# Patient Record
Sex: Male | Born: 1975 | Race: White | Hispanic: No | Marital: Married | Smoking: Former smoker
Health system: Southern US, Community
[De-identification: ages and names within clinical notes are randomized; demographics above are authoritative.]

## PROBLEM LIST (undated history)

## (undated) ENCOUNTER — Emergency Department: Admission: EM | Payer: Self-pay

## (undated) DIAGNOSIS — F32A Depression, unspecified: Secondary | ICD-10-CM

## (undated) DIAGNOSIS — G9340 Encephalopathy, unspecified: Secondary | ICD-10-CM

## (undated) DIAGNOSIS — F329 Major depressive disorder, single episode, unspecified: Secondary | ICD-10-CM

## (undated) DIAGNOSIS — I82409 Acute embolism and thrombosis of unspecified deep veins of unspecified lower extremity: Secondary | ICD-10-CM

## (undated) DIAGNOSIS — J9621 Acute and chronic respiratory failure with hypoxia: Secondary | ICD-10-CM

## (undated) DIAGNOSIS — S271XXA Traumatic hemothorax, initial encounter: Secondary | ICD-10-CM

## (undated) DIAGNOSIS — Z889 Allergy status to unspecified drugs, medicaments and biological substances status: Secondary | ICD-10-CM

## (undated) DIAGNOSIS — J690 Pneumonitis due to inhalation of food and vomit: Secondary | ICD-10-CM

## (undated) HISTORY — DX: Acute and chronic respiratory failure with hypoxia: J96.21

## (undated) HISTORY — DX: Pneumonitis due to inhalation of food and vomit: J69.0

## (undated) HISTORY — PX: KNEE ARTHROSCOPY: SHX127

## (undated) HISTORY — DX: Encephalopathy, unspecified: G93.40

## (undated) HISTORY — DX: Traumatic hemothorax, initial encounter: S27.1XXA

## (undated) HISTORY — PX: FRACTURE SURGERY: SHX138

---

## 2005-01-27 ENCOUNTER — Emergency Department: Payer: Self-pay | Admitting: Emergency Medicine

## 2005-03-02 ENCOUNTER — Emergency Department: Payer: Self-pay | Admitting: Internal Medicine

## 2005-07-27 ENCOUNTER — Emergency Department: Payer: Self-pay | Admitting: Emergency Medicine

## 2006-03-03 ENCOUNTER — Emergency Department: Payer: Self-pay | Admitting: Emergency Medicine

## 2006-11-27 ENCOUNTER — Emergency Department: Payer: Self-pay | Admitting: Emergency Medicine

## 2007-05-29 ENCOUNTER — Emergency Department: Payer: Self-pay | Admitting: Emergency Medicine

## 2008-03-25 ENCOUNTER — Emergency Department: Payer: Self-pay | Admitting: Emergency Medicine

## 2008-04-01 ENCOUNTER — Emergency Department: Payer: Self-pay | Admitting: Emergency Medicine

## 2008-11-27 ENCOUNTER — Emergency Department: Payer: Self-pay | Admitting: Emergency Medicine

## 2008-12-05 ENCOUNTER — Observation Stay: Payer: Self-pay | Admitting: *Deleted

## 2009-02-25 ENCOUNTER — Emergency Department: Payer: Self-pay | Admitting: Emergency Medicine

## 2009-03-12 ENCOUNTER — Emergency Department: Payer: Self-pay | Admitting: Emergency Medicine

## 2009-12-13 ENCOUNTER — Emergency Department: Payer: Self-pay | Admitting: Emergency Medicine

## 2009-12-22 ENCOUNTER — Emergency Department: Payer: Self-pay | Admitting: Emergency Medicine

## 2010-01-31 ENCOUNTER — Emergency Department: Payer: Self-pay | Admitting: Emergency Medicine

## 2011-03-28 ENCOUNTER — Emergency Department: Payer: Self-pay | Admitting: Unknown Physician Specialty

## 2011-11-03 ENCOUNTER — Emergency Department: Payer: Self-pay | Admitting: Emergency Medicine

## 2011-11-12 ENCOUNTER — Emergency Department: Payer: Self-pay | Admitting: Internal Medicine

## 2013-01-07 ENCOUNTER — Emergency Department: Payer: Self-pay | Admitting: Emergency Medicine

## 2013-08-08 ENCOUNTER — Other Ambulatory Visit: Payer: Self-pay | Admitting: Sports Medicine

## 2013-08-08 DIAGNOSIS — M79671 Pain in right foot: Secondary | ICD-10-CM

## 2013-08-12 ENCOUNTER — Emergency Department: Payer: Self-pay | Admitting: Emergency Medicine

## 2013-08-19 ENCOUNTER — Ambulatory Visit
Admission: RE | Admit: 2013-08-19 | Discharge: 2013-08-19 | Disposition: A | Discharge status: Home / Self Care | Payer: BC Managed Care – PPO | Source: Ambulatory Visit | Attending: Sports Medicine | Admitting: Sports Medicine

## 2013-08-19 DIAGNOSIS — M79671 Pain in right foot: Secondary | ICD-10-CM

## 2013-08-19 MED ORDER — GADOBENATE DIMEGLUMINE 529 MG/ML IV SOLN
20.0000 mL | Freq: Once | INTRAVENOUS | Status: AC | PRN
  Administered 2013-08-19: 20 mL via INTRAVENOUS

## 2013-09-16 ENCOUNTER — Other Ambulatory Visit (HOSPITAL_COMMUNITY): Payer: Self-pay | Admitting: Orthopedic Surgery

## 2013-09-18 ENCOUNTER — Encounter (HOSPITAL_COMMUNITY): Payer: Self-pay | Admitting: Pharmacy Technician

## 2013-09-23 ENCOUNTER — Ambulatory Visit (HOSPITAL_COMMUNITY)
Admission: RE | Admit: 2013-09-23 | Discharge: 2013-09-23 | Disposition: A | Discharge status: Home / Self Care | Payer: BC Managed Care – PPO | Source: Ambulatory Visit | Attending: Orthopedic Surgery | Admitting: Orthopedic Surgery

## 2013-09-23 ENCOUNTER — Encounter (HOSPITAL_COMMUNITY): Payer: Self-pay

## 2013-09-23 ENCOUNTER — Encounter (HOSPITAL_COMMUNITY)
Admission: RE | Admit: 2013-09-23 | Discharge: 2013-09-23 | Disposition: A | Discharge status: Home / Self Care | Payer: BC Managed Care – PPO | Source: Ambulatory Visit | Attending: Orthopedic Surgery | Admitting: Orthopedic Surgery

## 2013-09-23 DIAGNOSIS — Z01812 Encounter for preprocedural laboratory examination: Secondary | ICD-10-CM | POA: Insufficient documentation

## 2013-09-23 DIAGNOSIS — Z01810 Encounter for preprocedural cardiovascular examination: Secondary | ICD-10-CM | POA: Insufficient documentation

## 2013-09-23 DIAGNOSIS — Z01818 Encounter for other preprocedural examination: Secondary | ICD-10-CM

## 2013-09-23 HISTORY — DX: Allergy status to unspecified drugs, medicaments and biological substances: Z88.9

## 2013-09-23 HISTORY — DX: Acute embolism and thrombosis of unspecified deep veins of unspecified lower extremity: I82.409

## 2013-09-23 LAB — CBC
HCT: 42.2 % (ref 39.0–52.0)
Hemoglobin: 14.7 g/dL (ref 13.0–17.0)
MCH: 32.5 pg (ref 26.0–34.0)
MCHC: 34.8 g/dL (ref 30.0–36.0)
MCV: 93.2 fL (ref 78.0–100.0)
Platelets: 214 10*3/uL (ref 150–400)
RBC: 4.53 MIL/uL (ref 4.22–5.81)
RDW: 12.8 % (ref 11.5–15.5)
WBC: 7.8 10*3/uL (ref 4.0–10.5)

## 2013-09-23 LAB — COMPREHENSIVE METABOLIC PANEL
ALBUMIN: 4.1 g/dL (ref 3.5–5.2)
ALT: 39 U/L (ref 0–53)
AST: 23 U/L (ref 0–37)
Alkaline Phosphatase: 67 U/L (ref 39–117)
BUN: 14 mg/dL (ref 6–23)
CALCIUM: 9.1 mg/dL (ref 8.4–10.5)
CHLORIDE: 106 meq/L (ref 96–112)
CO2: 28 meq/L (ref 19–32)
CREATININE: 1.04 mg/dL (ref 0.50–1.35)
GFR calc Af Amer: >90 mL/min (ref >90)
GFR calc non Af Amer: 90 mL/min — ABNORMAL LOW (ref >90)
Glucose, Bld: 120 mg/dL — ABNORMAL HIGH (ref 70–99)
Potassium: 4.2 mEq/L (ref 3.7–5.3)
Sodium: 146 mEq/L (ref 137–147)
Total Bilirubin: 0.3 mg/dL (ref 0.3–1.2)
Total Protein: 7.2 g/dL (ref 6.0–8.3)

## 2013-09-23 LAB — PROTIME-INR
INR: 0.95 (ref 0.00–1.49)
Prothrombin Time: 12.5 seconds (ref 11.6–15.2)

## 2013-09-23 MED ORDER — DEXTROSE 5 % IV SOLN
3.0000 g | INTRAVENOUS | Status: AC
  Administered 2013-09-24: 3 g via INTRAVENOUS
  Filled 2013-09-23: qty 3000

## 2013-09-23 NOTE — Pre-Procedure Instructions (Signed)
Edward Soto  09/23/2013   Your procedure is scheduled on Wednesday, January 28.  Report to [facility]/ Entrance "A" at 9:35AM.  Call this number if you have problems the morning of surgery: [phone]   Remember:   Do not eat food or drink liquids after midnight.   Take these medicines the morning of surgery with A SIP OF WATER: none   Do not wear jewelry, make-up or nail polish.  Do not wear lotions, powders, or perfumes. You may wear deodorant.  Do not shave 48 hours prior to surgery. Men may shave face and neck.  Do not bring valuables to the hospital.  [facility] is not responsible for any belongings or valuables.               Contacts, dentures or bridgework may not be worn into surgery.  Leave suitcase in the car. After surgery it may be brought to your room.  For patients admitted to the hospital, discharge time is determined by your  treatment team.               Patients discharged the day of surgery will not be allowed to drive home.  Name and phone number of your driver: -   Special Instructions: Shower with CHG wash (Bactoshield) tonight and again in the am prior to arriving to hospital.   Please read over the following fact sheets that you were given: Pain Booklet, Coughing and Deep Breathing and Surgical Site Infection Prevention

## 2013-09-24 ENCOUNTER — Encounter (HOSPITAL_COMMUNITY): Payer: BC Managed Care – PPO | Admitting: Certified Registered"

## 2013-09-24 ENCOUNTER — Encounter (HOSPITAL_COMMUNITY)
Admission: RE | Disposition: A | Discharge status: Home / Self Care | Payer: Self-pay | Source: Ambulatory Visit | Attending: Orthopedic Surgery

## 2013-09-24 ENCOUNTER — Ambulatory Visit (HOSPITAL_COMMUNITY): Payer: BC Managed Care – PPO | Admitting: Certified Registered"

## 2013-09-24 ENCOUNTER — Ambulatory Visit (HOSPITAL_COMMUNITY)
Admission: RE | Admit: 2013-09-24 | Discharge: 2013-09-24 | Disposition: A | Discharge status: Home / Self Care | Payer: BC Managed Care – PPO | Source: Ambulatory Visit | Attending: Orthopedic Surgery | Admitting: Orthopedic Surgery

## 2013-09-24 DIAGNOSIS — Z86718 Personal history of other venous thrombosis and embolism: Secondary | ICD-10-CM | POA: Insufficient documentation

## 2013-09-24 DIAGNOSIS — F17200 Nicotine dependence, unspecified, uncomplicated: Secondary | ICD-10-CM | POA: Insufficient documentation

## 2013-09-24 DIAGNOSIS — D1801 Hemangioma of skin and subcutaneous tissue: Secondary | ICD-10-CM | POA: Insufficient documentation

## 2013-09-24 DIAGNOSIS — E669 Obesity, unspecified: Secondary | ICD-10-CM | POA: Insufficient documentation

## 2013-09-24 HISTORY — PX: MASS EXCISION: SHX2000

## 2013-09-24 SURGERY — EXCISION MASS
Anesthesia: General | Site: Foot | Laterality: Right

## 2013-09-24 MED ORDER — MIDAZOLAM HCL 5 MG/5ML IJ SOLN
INTRAMUSCULAR | Status: DC | PRN
  Administered 2013-09-24: 2 mg via INTRAVENOUS

## 2013-09-24 MED ORDER — 0.9 % SODIUM CHLORIDE (POUR BTL) OPTIME
TOPICAL | Status: DC | PRN
  Administered 2013-09-24: 1000 mL

## 2013-09-24 MED ORDER — LACTATED RINGERS IV SOLN
INTRAVENOUS | Status: DC | PRN
  Administered 2013-09-24: 12:00:00 via INTRAVENOUS

## 2013-09-24 MED ORDER — HYDROMORPHONE HCL PF 1 MG/ML IJ SOLN
INTRAMUSCULAR | Status: AC
  Filled 2013-09-24: qty 1

## 2013-09-24 MED ORDER — ONDANSETRON HCL 4 MG/2ML IJ SOLN
INTRAMUSCULAR | Status: AC
  Filled 2013-09-24: qty 2

## 2013-09-24 MED ORDER — ONDANSETRON HCL 4 MG/2ML IJ SOLN
INTRAMUSCULAR | Status: DC | PRN
  Administered 2013-09-24: 4 mg via INTRAVENOUS

## 2013-09-24 MED ORDER — ALBUTEROL SULFATE HFA 108 (90 BASE) MCG/ACT IN AERS
INHALATION_SPRAY | RESPIRATORY_TRACT | Status: AC
  Filled 2013-09-24: qty 6.7

## 2013-09-24 MED ORDER — LACTATED RINGERS IV SOLN: INTRAVENOUS | Status: DC

## 2013-09-24 MED ORDER — OXYCODONE HCL 5 MG/5ML PO SOLN: 5.0000 mg | Freq: Once | ORAL | Status: AC | PRN

## 2013-09-24 MED ORDER — LIDOCAINE HCL (CARDIAC) 20 MG/ML IV SOLN: INTRAVENOUS | Status: DC | PRN

## 2013-09-24 MED ORDER — OXYCODONE-ACETAMINOPHEN 5-325 MG PO TABS: 1.0000 | ORAL_TABLET | ORAL | Status: DC | PRN

## 2013-09-24 MED ORDER — SUCCINYLCHOLINE CHLORIDE 20 MG/ML IJ SOLN
INTRAMUSCULAR | Status: DC | PRN
  Administered 2013-09-24: 140 mg via INTRAVENOUS

## 2013-09-24 MED ORDER — FENTANYL CITRATE 0.05 MG/ML IJ SOLN
INTRAMUSCULAR | Status: DC | PRN
  Administered 2013-09-24 (×2): 50 ug via INTRAVENOUS
  Administered 2013-09-24: 150 ug via INTRAVENOUS

## 2013-09-24 MED ORDER — PROPOFOL 10 MG/ML IV BOLUS
INTRAVENOUS | Status: DC | PRN
  Administered 2013-09-24: 200 mg via INTRAVENOUS

## 2013-09-24 MED ORDER — OXYCODONE HCL 5 MG PO TABS: 5.0000 mg | ORAL_TABLET | Freq: Once | ORAL | Status: AC | PRN

## 2013-09-24 MED ORDER — HYDROMORPHONE HCL PF 1 MG/ML IJ SOLN
0.2500 mg | INTRAMUSCULAR | Status: DC | PRN
  Administered 2013-09-24: 0.5 mg via INTRAVENOUS

## 2013-09-24 MED ORDER — ONDANSETRON HCL 4 MG/2ML IJ SOLN: 4.0000 mg | Freq: Once | INTRAMUSCULAR | Status: AC | PRN

## 2013-09-24 MED ORDER — ALBUTEROL SULFATE HFA 108 (90 BASE) MCG/ACT IN AERS
INHALATION_SPRAY | RESPIRATORY_TRACT | Status: DC | PRN
  Administered 2013-09-24: 2 via RESPIRATORY_TRACT

## 2013-09-24 MED ORDER — LIDOCAINE HCL (CARDIAC) 20 MG/ML IV SOLN
INTRAVENOUS | Status: DC | PRN
Start: 2013-09-24 — End: 2013-09-24
  Administered 2013-09-24: 60 mg via INTRAVENOUS

## 2013-09-24 SURGICAL SUPPLY — 40 items
BANDAGE GAUZE 4  KLING STR (GAUZE/BANDAGES/DRESSINGS) IMPLANT
BNDG COHESIVE 6X5 TAN STRL LF (GAUZE/BANDAGES/DRESSINGS) ×3 IMPLANT
BNDG ESMARK 4X9 LF (GAUZE/BANDAGES/DRESSINGS) ×3 IMPLANT
BNDG GAUZE ELAST 4 BULKY (GAUZE/BANDAGES/DRESSINGS) ×3 IMPLANT
BNDG GAUZE STRTCH 6 (GAUZE/BANDAGES/DRESSINGS) IMPLANT
CLOTH BEACON ORANGE TIMEOUT ST (SAFETY) IMPLANT
CORDS BIPOLAR (ELECTRODE) IMPLANT
COVER SURGICAL LIGHT HANDLE (MISCELLANEOUS) ×3 IMPLANT
DRAPE U-SHAPE 47X51 STRL (DRAPES) ×3 IMPLANT
DRSG ADAPTIC 3X8 NADH LF (GAUZE/BANDAGES/DRESSINGS) ×3 IMPLANT
DRSG PAD ABDOMINAL 8X10 ST (GAUZE/BANDAGES/DRESSINGS) ×3 IMPLANT
DURAPREP 26ML APPLICATOR (WOUND CARE) ×3 IMPLANT
ELECT REM PT RETURN 9FT ADLT (ELECTROSURGICAL) ×3
ELECTRODE REM PT RTRN 9FT ADLT (ELECTROSURGICAL) ×1 IMPLANT
GLOVE BIOGEL PI IND STRL 9 (GLOVE) ×1 IMPLANT
GLOVE BIOGEL PI INDICATOR 9 (GLOVE) ×2
GLOVE SURG ORTHO 9.0 STRL STRW (GLOVE) ×3 IMPLANT
GOWN PREVENTION PLUS XLARGE (GOWN DISPOSABLE) ×9 IMPLANT
GOWN SRG XL XLNG 56XLVL 4 (GOWN DISPOSABLE) IMPLANT
GOWN STRL NON-REIN XL XLG LVL4 (GOWN DISPOSABLE)
KIT BASIN OR (CUSTOM PROCEDURE TRAY) ×3 IMPLANT
KIT ROOM TURNOVER OR (KITS) ×3 IMPLANT
MANIFOLD NEPTUNE II (INSTRUMENTS) IMPLANT
NEEDLE HYPO 25GX1X1/2 BEV (NEEDLE) IMPLANT
NS IRRIG 1000ML POUR BTL (IV SOLUTION) ×3 IMPLANT
PACK ORTHO EXTREMITY (CUSTOM PROCEDURE TRAY) ×3 IMPLANT
PAD ARMBOARD 7.5X6 YLW CONV (MISCELLANEOUS) ×6 IMPLANT
PAD CAST 4YDX4 CTTN HI CHSV (CAST SUPPLIES) IMPLANT
PADDING CAST COTTON 4X4 STRL (CAST SUPPLIES)
SPECIMEN JAR SMALL (MISCELLANEOUS) IMPLANT
SPONGE GAUZE 4X4 12PLY (GAUZE/BANDAGES/DRESSINGS) ×3 IMPLANT
SUCTION FRAZIER TIP 10 FR DISP (SUCTIONS) IMPLANT
SUT ETHILON 2 0 PSLX (SUTURE) ×3 IMPLANT
SUT VIC AB 2-0 FS1 27 (SUTURE) IMPLANT
SYR CONTROL 10ML LL (SYRINGE) IMPLANT
TOWEL OR 17X24 6PK STRL BLUE (TOWEL DISPOSABLE) ×3 IMPLANT
TOWEL OR 17X26 10 PK STRL BLUE (TOWEL DISPOSABLE) ×3 IMPLANT
TUBE CONNECTING 12'X1/4 (SUCTIONS)
TUBE CONNECTING 12X1/4 (SUCTIONS) IMPLANT
WATER STERILE IRR 1000ML POUR (IV SOLUTION) IMPLANT

## 2013-09-24 NOTE — Anesthesia Postprocedure Evaluation (Signed)
  Anesthesia Post-op Note  Patient: Edward Soto  Procedure(s) Performed: Procedure(s) with comments: EXCISION MASS (Right) - Right Foot Excision Dorsal and Plantar Hemangioma  Patient Location: PACU  Anesthesia Type:General  Level of Consciousness: awake, alert  and oriented  Airway and Oxygen Therapy: Patient Spontanous Breathing  Post-op Pain: mild  Post-op Assessment: Post-op Vital signs reviewed, Patient's Cardiovascular Status Stable, Respiratory Function Stable, Patent Airway, No signs of Nausea or vomiting and Pain level controlled  Post-op Vital Signs: stable  Complications: No apparent anesthesia complications

## 2013-09-24 NOTE — Op Note (Signed)
OPERATIVE REPORT  DATE OF SURGERY: 09/24/2013  PATIENT:  Edward Soto,  38 y.o. male  PRE-OPERATIVE DIAGNOSIS:  Hemangioma Right Foot  POST-OPERATIVE DIAGNOSIS:  hemangioma right foot  PROCEDURE:  Procedure(s): EXCISION MASS right foot  SURGEON:  Surgeon(s): Newt Minion, MD  ANESTHESIA:   general  EBL:  Minimal ML  SPECIMEN:  Source of Specimen:  Hemangioma plantar aspect right foot  TOURNIQUET:  * No tourniquets in log *  PROCEDURE DETAILS: Patient is a 38 year old gentleman who complains of a painful mass on the plantar aspect right foot. Patient has failed conservative care. MRI scan shows a large hemangioma on the plantar aspect right foot as well as a small hemangioma mass in the dorsum of the right foot. Due to pain with activities daily living and pain with shoe wear unable to work due to pain patient was to proceed with excision of the painful mass. Risks and benefits were discussed including infection neurovascular injury nonhealing of the wound potential for malignancy. Patient states he understands and wished to proceed at this time. Description of procedure patient was brought to the operating room and underwent a general anesthetic. After adequate levels of anesthesia were obtained patient's right lower extremity was prepped using DuraPrep and draped into a sterile field. A dorsal incision was made over these mobile mass over the dorsum of the third metatarsal. This appeared to have more consistency of adipose fat as well as hemangioma it tissue. This was resected the branches of the superficial peroneal nerve were protected the wound was irrigated with normal saline the incision was closed in 2-0 nylon. Attention was then focused to the plantar aspect of foot. Patient had 2 lobulated large masses the incision was made midline longitudinally between these masses. The masses were resected in one block of tissue down to the plantar fascia. The Esmarch was released and hemostasis  was obtained the incision was healed with normal saline. The tissue mass was sent to pathology for confirmation of hemangioma. The incision was closed using 2-0 nylon. The soft tissue mass in the plantar aspect of the foot measured approximately 5 x 8 cm in diameter. The wound was closed after hemostasis was obtained with 2-0 nylon. The wounds were covered Adaptic orthopedic sponges ABDs dressing Kerlix and Coban. Patient was extubated taken to the PACU in stable condition.  PLAN OF CARE: Discharge to home after PACU  PATIENT DISPOSITION:  PACU - hemodynamically stable.   Newt Minion, MD 09/24/2013 12:38 PM

## 2013-09-24 NOTE — Progress Notes (Signed)
Anesthesiology Postoperative Note:  Mr. Edward Soto is a 38 year old male with obesity and a history of smoking. He underwent resection of a right foot dorsal and plantar hemangioma by Dr. Sharol Given  today. The surgery was uneventful and he had mild wheezing and frequent coughing during emergence  from anesthesia. While in the Recovery Room, his oxygen saturations very from 85-98% on room air. He denied any shortness of breath and his breath sounds were clear with no wheezing. Following incentive spirometry and movement to the subacute side of the recovery room, his oxygen saturations remained at 90-92% on room air. It was determined that he was stable for discharge home.   Roberts Gaudy, MD

## 2013-09-24 NOTE — Progress Notes (Signed)
Orthopedic Tech Progress Note Patient Details:  Edward Soto [birth year] [id number]  Ortho Devices Type of Ortho Device: Crutches;Postop shoe/boot Ortho Device/Splint Interventions: Application   Cammer, Theodoro Parma 09/24/2013, 3:18 PM

## 2013-09-24 NOTE — Anesthesia Procedure Notes (Signed)
Procedure Name: Intubation Date/Time: 09/24/2013 12:06 PM Performed by: Maeola Harman Pre-anesthesia Checklist: Patient identified, Emergency Drugs available, Suction available, Patient being monitored and Timeout performed Patient Re-evaluated:Patient Re-evaluated prior to inductionOxygen Delivery Method: Circle system utilized Preoxygenation: Pre-oxygenation with 100% oxygen Intubation Type: IV induction and Rapid sequence Ventilation: Two handed mask ventilation required and Oral airway inserted - appropriate to patient size Laryngoscope Size: Mac and 3 Grade View: Grade I Tube type: Oral Tube size: 7.5 mm Number of attempts: 1 Airway Equipment and Method: Stylet Placement Confirmation: ETT inserted through vocal cords under direct vision,  positive ETCO2 and breath sounds checked- equal and bilateral Secured at: 22 cm Tube secured with: Tape Dental Injury: Teeth and Oropharynx as per pre-operative assessment  Comments: Easy induction and LMA # 5 placed with air leak noted.  Removed LMA and RSI performed with MAC 3 blade, grade I view.  Dr. Linna Caprice verified placement of ETT.  Edward Session, CRNA

## 2013-09-24 NOTE — Anesthesia Preprocedure Evaluation (Addendum)
Anesthesia Evaluation  Patient identified by MRN, date of birth, ID band Patient awake    Reviewed: Allergy & Precautions, H&P , NPO status , Patient's Chart, lab work & pertinent test results  Airway Mallampati: II TM Distance: >3 FB Neck ROM: Full    Dental  (+) Poor Dentition and Dental Advisory Given   Pulmonary Current Smoker,          Cardiovascular Rhythm:Regular Rate:Normal     Neuro/Psych    GI/Hepatic   Endo/Other    Renal/GU      Musculoskeletal   Abdominal (+) + obese,   Peds  Hematology   Anesthesia Other Findings   Reproductive/Obstetrics                          Anesthesia Physical Anesthesia Plan  ASA: III  Anesthesia Plan: General   Post-op Pain Management:    Induction: Intravenous  Airway Management Planned: Oral ETT  Additional Equipment:   Intra-op Plan:   Post-operative Plan:   Informed Consent: I have reviewed the patients History and Physical, chart, labs and discussed the procedure including the risks, benefits and alternatives for the proposed anesthesia with the patient or authorized representative who has indicated his/her understanding and acceptance.   Dental advisory given  Plan Discussed with: CRNA and Anesthesiologist  Anesthesia Plan Comments: (Mass R.Foot Smoker Obesity  Plan GA with LMA)        Anesthesia Quick Evaluation

## 2013-09-24 NOTE — Progress Notes (Signed)
IV of lactated ringers started at 50 ml/hr at 1045.

## 2013-09-24 NOTE — Preoperative (Signed)
Beta Blockers   Reason not to administer Beta Blockers:Not Applicable 

## 2013-09-24 NOTE — H&P (Signed)
Edward Soto is an 38 y.o. male.   Chief Complaint: Hemangioma plantar and dorsal aspect right foot HPI: Patient is a 38 year old gentleman who presents complaining of increasing pain in the right foot. He states he is unable to work or perform activities daily living due to pain.  Past Medical History  Diagnosis Date  . H/O seasonal allergies   . DVT (deep venous thrombosis)     right leg    Past Surgical History  Procedure Laterality Date  . Knee arthroscopy Right   . Fracture surgery Right     No family history on file. Social History:  reports that he has been smoking.  He does not have any smokeless tobacco history on file. He reports that he does not drink alcohol or use illicit drugs.  Allergies: No Known Allergies  No prescriptions prior to admission    Results for orders placed during the hospital encounter of 09/23/13 (from the past 48 hour(s))  CBC     Status: None   Collection Time    09/23/13  2:28 PM      Result Value Range   WBC 7.8  4.0 - 10.5 K/uL   RBC 4.53  4.22 - 5.81 MIL/uL   Hemoglobin 14.7  13.0 - 17.0 g/dL   HCT 42.2  39.0 - 52.0 %   MCV 93.2  78.0 - 100.0 fL   MCH 32.5  26.0 - 34.0 pg   MCHC 34.8  30.0 - 36.0 g/dL   RDW 12.8  11.5 - 15.5 %   Platelets 214  150 - 400 K/uL  COMPREHENSIVE METABOLIC PANEL     Status: Abnormal   Collection Time    09/23/13  2:28 PM      Result Value Range   Sodium 146  137 - 147 mEq/L   Potassium 4.2  3.7 - 5.3 mEq/L   Chloride 106  96 - 112 mEq/L   CO2 28  19 - 32 mEq/L   Glucose, Bld 120 (*) 70 - 99 mg/dL   BUN 14  6 - 23 mg/dL   Creatinine, Ser 1.04  0.50 - 1.35 mg/dL   Calcium 9.1  8.4 - 10.5 mg/dL   Total Protein 7.2  6.0 - 8.3 g/dL   Albumin 4.1  3.5 - 5.2 g/dL   AST 23  0 - 37 U/L   ALT 39  0 - 53 U/L   Alkaline Phosphatase 67  39 - 117 U/L   Total Bilirubin 0.3  0.3 - 1.2 mg/dL   GFR calc non Af Amer 90 (*) >90 mL/min   GFR calc Af Amer >90  >90 mL/min   Comment: (NOTE)     The eGFR has been  calculated using the CKD EPI equation.     This calculation has not been validated in all clinical situations.     eGFR's persistently <90 mL/min signify possible Chronic Kidney     Disease.  PROTIME-INR     Status: None   Collection Time    09/23/13  2:28 PM      Result Value Range   Prothrombin Time 12.5  11.6 - 15.2 seconds   INR 0.95  0.00 - 1.49   Dg Chest 2 View  09/23/2013   CLINICAL DATA:  Preop right foot surgery.  EXAM: CHEST - 2 VIEW  COMPARISON:  None available  FINDINGS: Lungs are clear. Heart size and mediastinal contours are within normal limits. No effusion. Visualized skeletal structures are unremarkable.  IMPRESSION: No acute cardiopulmonary disease.   Electronically Signed   By: Arne Cleveland M.D.   On: 09/23/2013 16:25    Review of Systems  All other systems reviewed and are negative.    There were no vitals taken for this visit. Physical Exam  On examination patient has a palpable dorsalis pedis pulse. Patient has a large soft tissue mass over the dorsum of the  third metatarsal right foot as well as a soft tissue mass of the plantar aspect of the first and second metatarsals right foot. 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  Plan: Will plan for excision of the hemangioma. Risks and benefits were discussed including infection neurovascular injury bleeding recurrence potential for malignancy need for additional surgery. Patient states he understands was pursued this time.  Mattelyn Imhoff V 09/24/2013, 6:45 AM

## 2013-09-24 NOTE — Transfer of Care (Signed)
Immediate Anesthesia Transfer of Care Note  Patient: Edward Soto  Procedure(s) Performed: Procedure(s) with comments: EXCISION MASS (Right) - Right Foot Excision Dorsal and Plantar Hemangioma  Patient Location: PACU  Anesthesia Type:General  Level of Consciousness: awake, alert  and sedated  Airway & Oxygen Therapy: Patient connected to face mask oxygen  Post-op Assessment: Report given to PACU RN  Post vital signs: stable  Complications: No apparent anesthesia complications

## 2013-09-25 ENCOUNTER — Encounter (HOSPITAL_COMMUNITY): Payer: Self-pay | Admitting: Orthopedic Surgery

## 2014-02-14 ENCOUNTER — Emergency Department: Payer: Self-pay | Admitting: Emergency Medicine

## 2014-04-13 ENCOUNTER — Emergency Department: Payer: Self-pay | Admitting: Student

## 2014-10-21 ENCOUNTER — Emergency Department: Payer: Self-pay | Admitting: Emergency Medicine

## 2015-02-12 ENCOUNTER — Ambulatory Visit: Payer: No Typology Code available for payment source | Attending: Otolaryngology

## 2015-02-12 DIAGNOSIS — R030 Elevated blood-pressure reading, without diagnosis of hypertension: Secondary | ICD-10-CM | POA: Insufficient documentation

## 2015-02-12 DIAGNOSIS — G4733 Obstructive sleep apnea (adult) (pediatric): Secondary | ICD-10-CM | POA: Diagnosis not present

## 2015-02-12 DIAGNOSIS — E669 Obesity, unspecified: Secondary | ICD-10-CM | POA: Insufficient documentation

## 2015-02-12 DIAGNOSIS — R5383 Other fatigue: Secondary | ICD-10-CM | POA: Diagnosis present

## 2015-02-12 DIAGNOSIS — R0683 Snoring: Secondary | ICD-10-CM | POA: Diagnosis not present

## 2015-03-04 ENCOUNTER — Encounter: Payer: Self-pay | Admitting: Medical Oncology

## 2015-03-04 ENCOUNTER — Emergency Department
Admission: EM | Admit: 2015-03-04 | Discharge: 2015-03-04 | Disposition: A | Discharge status: Home / Self Care | Payer: No Typology Code available for payment source | Attending: Emergency Medicine | Admitting: Emergency Medicine

## 2015-03-04 ENCOUNTER — Emergency Department: Payer: No Typology Code available for payment source

## 2015-03-04 DIAGNOSIS — Y9289 Other specified places as the place of occurrence of the external cause: Secondary | ICD-10-CM | POA: Diagnosis not present

## 2015-03-04 DIAGNOSIS — Z720 Tobacco use: Secondary | ICD-10-CM | POA: Insufficient documentation

## 2015-03-04 DIAGNOSIS — S92355A Nondisplaced fracture of fifth metatarsal bone, left foot, initial encounter for closed fracture: Secondary | ICD-10-CM | POA: Insufficient documentation

## 2015-03-04 DIAGNOSIS — S92302A Fracture of unspecified metatarsal bone(s), left foot, initial encounter for closed fracture: Secondary | ICD-10-CM

## 2015-03-04 DIAGNOSIS — X58XXXA Exposure to other specified factors, initial encounter: Secondary | ICD-10-CM | POA: Diagnosis not present

## 2015-03-04 DIAGNOSIS — Y9389 Activity, other specified: Secondary | ICD-10-CM | POA: Diagnosis not present

## 2015-03-04 DIAGNOSIS — Y998 Other external cause status: Secondary | ICD-10-CM | POA: Insufficient documentation

## 2015-03-04 DIAGNOSIS — S99912A Unspecified injury of left ankle, initial encounter: Secondary | ICD-10-CM | POA: Diagnosis present

## 2015-03-04 HISTORY — DX: Major depressive disorder, single episode, unspecified: F32.9

## 2015-03-04 HISTORY — DX: Depression, unspecified: F32.A

## 2015-03-04 MED ORDER — HYDROCODONE-ACETAMINOPHEN 5-325 MG PO TABS: 1.0000 | ORAL_TABLET | ORAL | Status: DC | PRN

## 2015-03-04 MED ORDER — IBUPROFEN 800 MG PO TABS: 800.0000 mg | ORAL_TABLET | Freq: Three times a day (TID) | ORAL | Status: DC

## 2015-03-04 NOTE — ED Provider Notes (Signed)
[facility] Emergency Department Provider Note  ____________________________________________  Time seen: 12:27 PM  I have reviewed the triage vital signs and the nursing notes.   HISTORY  Chief Complaint Ankle Pain   HPI Edward Soto is a 39 y.o. male patient states he twisted his left ankle last p.m. while trying to get up off the couch. He states he felt a pop. Pain has increased today on the lateral aspect of his ankle and foot. There is been some swelling. He has not taken any medication over-the-counter. Pain is increased with walking and decreases when he has it  elevated. Currently his pain is 7/10.He denies any previous fractures to this ankle.   Past Medical History  Diagnosis Date  . H/O seasonal allergies   . DVT (deep venous thrombosis)     right leg  . Depression     There are no active problems to display for this patient.   Past Surgical History  Procedure Laterality Date  . Knee arthroscopy Right   . Fracture surgery Right   . Mass excision Right 09/24/2013    Procedure: EXCISION MASS;  Surgeon: Newt Minion, MD;  Location: [facility];  Service: Orthopedics;  Laterality: Right;  Right Foot Excision Dorsal and Plantar Hemangioma    Current Outpatient Rx  Name  Route  Sig  Dispense  Refill  . HYDROcodone-acetaminophen (NORCO/VICODIN) 5-325 MG per tablet   Oral   Take 1 tablet by mouth every 4 (four) hours as needed for moderate pain.   20 tablet   0   . ibuprofen (ADVIL,MOTRIN) 800 MG tablet   Oral   Take 1 tablet (800 mg total) by mouth 3 (three) times daily.   30 tablet   0     Allergies Review of patient's allergies indicates no known allergies.  No family history on file.  Social History History  Substance Use Topics  . Smoking status: Current Some Day Smoker -- 17 years  . Smokeless tobacco: Not on file  . Alcohol Use: No    Review of Systems Constitutional: No fever/chills Eyes: No visual changes. ENT: No  sore throat. Cardiovascular: Denies chest pain. Respiratory: Denies shortness of breath. Gastrointestinal: No abdominal pain.  No nausea, no vomiting.  Genitourinary: Negative for dysuria. Musculoskeletal: Negative for back pain. Skin: Negative for rash. Neurological: Negative for headaches, focal weakness or numbness.  10-point ROS otherwise negative.  ____________________________________________   PHYSICAL EXAM:  VITAL SIGNS: ED Triage Vitals  Enc Vitals Group     BP 03/04/15 1219 141/71 mmHg     Pulse Rate 03/04/15 1219 100     Resp 03/04/15 1219 20     Temp 03/04/15 1219 98.4 F (36.9 C)     Temp Source 03/04/15 1219 Oral     SpO2 03/04/15 1219 96 %     Weight 03/04/15 1219 350 lb (158.759 kg)     Height 03/04/15 1219 6\' 2"  (1.88 m)     Head Cir --      Peak Flow --      Pain Score 03/04/15 1219 7     Pain Loc --      Pain Edu? --      Excl. in [place]? --     Constitutional: Alert and oriented. Well appearing and in no acute distress. Eyes: Conjunctivae are normal. PERRL. EOMI. Head: Atraumatic. Nose: No congestion/rhinnorhea. Neck: No stridor.  Cardiovascular: Normal rate, regular rhythm. Grossly normal heart sounds.  Good peripheral circulation. Respiratory:  Normal respiratory effort.  No retractions. Lungs CTAB. Gastrointestinal: Soft and nontender. No distention.  Musculoskeletal:  Left ankle moderate edema lateral aspect. Range of motion is restricted secondary to pain. Left foot minimal edema noted,  markedly tender laterally. Motor sensory function intact. No gross deformity. Neurologic:  Normal speech and language. No gross focal neurologic deficits are appreciated. Speech is normal. Gait was not tested secondary to pain Skin:  Skin is warm, dry and intact. No rash noted. No ecchymosis or abrasions noted Psychiatric: Mood and affect are normal. Speech and behavior are normal.  ____________________________________________   LABS (all labs ordered are listed,  but only abnormal results are displayed)  Labs Reviewed - No data to display   RADIOLOGY  X-ray of left ankle shows no ankle fracture. There was a nondisplaced transverse fracture of the base of the fifth metatarsal. I, Johnn Hai, personally viewed and evaluated these images as part of my medical decision making.  ____________________________________________   PROCEDURES  Procedure(s) performed: None  Critical Care performed: No  ____________________________________________   INITIAL IMPRESSION / ASSESSMENT AND PLAN / ED COURSE  Pertinent labs & imaging results that were available during my care of the patient were reviewed by me and considered in my medical decision making (see chart for details  Patient was placed in an Ace wrap on his left ankle, a wooden shoe, and given crutches. A prescription for Norco and ibuprofen was given for pain. He is to follow-up with Dr. Vickki Muff at [facility] IMPRESSION(S) / ED DIAGNOSES  Final diagnoses:  Fracture of fifth metatarsal bone, left, closed, initial encounter      Johnn Hai, PA-C 03/04/15 [place], MD 03/04/15 1535

## 2015-03-04 NOTE — ED Notes (Signed)
Pt twisted left ankle last night.

## 2015-09-27 ENCOUNTER — Emergency Department
Admission: EM | Admit: 2015-09-27 | Discharge: 2015-09-27 | Disposition: A | Discharge status: Home / Self Care | Payer: No Typology Code available for payment source | Attending: Emergency Medicine | Admitting: Emergency Medicine

## 2015-09-27 ENCOUNTER — Encounter: Payer: Self-pay | Admitting: Emergency Medicine

## 2015-09-27 DIAGNOSIS — Y9289 Other specified places as the place of occurrence of the external cause: Secondary | ICD-10-CM | POA: Insufficient documentation

## 2015-09-27 DIAGNOSIS — M779 Enthesopathy, unspecified: Secondary | ICD-10-CM | POA: Insufficient documentation

## 2015-09-27 DIAGNOSIS — Z791 Long term (current) use of non-steroidal anti-inflammatories (NSAID): Secondary | ICD-10-CM | POA: Insufficient documentation

## 2015-09-27 DIAGNOSIS — Y998 Other external cause status: Secondary | ICD-10-CM | POA: Insufficient documentation

## 2015-09-27 DIAGNOSIS — M778 Other enthesopathies, not elsewhere classified: Secondary | ICD-10-CM

## 2015-09-27 DIAGNOSIS — S63501A Unspecified sprain of right wrist, initial encounter: Secondary | ICD-10-CM

## 2015-09-27 DIAGNOSIS — Y9389 Activity, other specified: Secondary | ICD-10-CM | POA: Insufficient documentation

## 2015-09-27 DIAGNOSIS — X58XXXA Exposure to other specified factors, initial encounter: Secondary | ICD-10-CM | POA: Insufficient documentation

## 2015-09-27 DIAGNOSIS — Z79899 Other long term (current) drug therapy: Secondary | ICD-10-CM | POA: Insufficient documentation

## 2015-09-27 DIAGNOSIS — S56911A Strain of unspecified muscles, fascia and tendons at forearm level, right arm, initial encounter: Secondary | ICD-10-CM

## 2015-09-27 DIAGNOSIS — F17200 Nicotine dependence, unspecified, uncomplicated: Secondary | ICD-10-CM | POA: Insufficient documentation

## 2015-09-27 NOTE — ED Notes (Signed)
States he developed pain to right wrist after helping someone moving ..pinching pain to post wrist and shooting pain  Into arm

## 2015-09-27 NOTE — Discharge Instructions (Signed)
Tendinitis and Tenosynovitis  Tendinitis is inflammation of the tendon. Tenosynovitis is inflammation of the lining around the tendon (tendon sheath). These painful conditions often occur at once. Tendons attach muscle to bone. To move a limb, force from the muscle moves through the tendon, to the bone. These conditions often cause increased pain when moving. Tendinitis may be caused by a small or partial tear in the tendon.  SYMPTOMS   Pain, tenderness, redness, bruising, or swelling at the injury.  Loss of normal joint movement.  Pain that gets worse with use of the muscle and joint attached to the tendon.  Weakness in the tendon, caused by calcium build up that may occur with tendinitis.  Commonly affected tendons:  Achilles tendon (calf of leg).  Rotator cuff (shoulder joint).  Patellar tendon (kneecap to shin).  Peroneal tendon (ankle).  Posterior tibial tendon (inner ankle).  Biceps tendon (in front of shoulder). CAUSES   Sudden strain on a flexed muscle, muscle overuse, sudden increase or change in activity, vigorous activity.  Result of a direct hit (less common).  Poor muscle action (biomechanics). RISK INCREASES WITH:  Injury (trauma).  Too much exercise.  Sudden change in athletic activity.  Incorrect exercise form or technique.  Poor strength and flexibility.  Not warming-up properly before activity.  Returning to activity before healing is complete. PREVENTION   Warm-up and stretch properly before activity.  Maintain physical fitness:  Joint flexibility.  Muscle strength and endurance.  Fitness that increases heart rate.  Learn and use proper exercise techniques.  Use rehabilitation exercises to strengthen weak muscles and tendons.  Ice the tendon after activity, to reduce recurring inflammation.  Wear proper fitting protective equipment for specific tendons, when indicated. PROGNOSIS  When treated properly, can be cured in 6 to 8 weeks.  Recovery may take longer, depending on degree of injury.  RELATED COMPLICATIONS   Re-injury or recurring symptoms.  Permanent weakness or joint stiffness, if injury is severe and recovery is not completed.  Delayed healing, if sports are started before healing is complete.  Tearing apart (rupture) of the inflamed tendon. Tendinitis means the tendon is injured and must recover. TREATMENT  Treatment first involves ice, medicine, and rest from aggravating activities. This reduces pain and inflammation. Modifying your activity may be considered to prevent recurring injury. A brace, elastic bandage wrap, splint, cast, or sling may be prescribed to protect the joint for a short period. After that period, strengthening and stretching exercise may help to regain strength and full range of motion. If the condition persists, despite non-surgical treatment, surgery may be recommended to remove the inflamed tendon lining. Corticosteroid injections may be given to reduce inflammation. However, these injections may weaken the tendon and increase your risk for tendon rupture. MEDICATION   If pain medicine is needed, nonsteroidal anti-inflammatory medicines (aspirin and ibuprofen), or other minor pain relievers (acetaminophen), are often recommended.  Do not take pain medicine for 7 days before surgery.  Prescription pain relievers are usually prescribed only after surgery. Use only as directed and only as much as you need.  Ointments applied to the skin may be helpful.  Corticosteroid injections may be given to reduce inflammation. However, this may increase your risk of a tendon rupture. HEAT AND COLD  Cold treatment (icing) relieves pain and reduces inflammation. Cold treatment should be applied for 10 to 15 minutes every 2 to 3 hours, and immediately after activity that aggravates your symptoms. Use ice packs or an ice massage.  Heat  treatment may be used before performing stretching and strengthening  activities prescribed by your caregiver, physical therapist, or athletic trainer. Use a heat pack or a warm water soak. SEEK MEDICAL CARE IF:   Symptoms get worse or do not improve, despite treatment.  Pain becomes too much to tolerate.  You develop numbness or tingling.  Toes become cold, or toenails become blue, gray, or dark colored.  New, unexplained symptoms develop. (Drugs used in treatment may produce side effects.)   This information is not intended to replace advice given to you by your health care provider. Make sure you discuss any questions you have with your health care provider.   Document Released: 08/14/2005 Document Revised: 11/06/2011 Document Reviewed: 11/26/2008 Elsevier Interactive Patient Education 2016 [employer]  Wrist Sprain With Rehab A sprain is an injury in which a ligament that maintains the proper alignment of a joint is partially or completely torn. The ligaments of the wrist are susceptible to sprains. Sprains are classified into three categories. Grade 1 sprains cause pain, but the tendon is not lengthened. Grade 2 sprains include a lengthened ligament because the ligament is stretched or partially ruptured. With grade 2 sprains there is still function, although the function may be diminished. Grade 3 sprains are characterized by a complete tear of the tendon or muscle, and function is usually impaired. SYMPTOMS   Pain tenderness, inflammation, and/or bruising (contusion) of the injury.  A "pop" or tear felt and/or heard at the time of injury.  Decreased wrist function. CAUSES  A wrist sprain occurs when a force is placed on one or more ligaments that is greater than it/they can withstand. Common mechanisms of injury include:  Catching a ball with your hands.  Repetitive and/ or strenuous extension or flexion of the wrist. RISK INCREASES WITH:  Previous wrist injury.  Contact sports (boxing or wrestling).  Activities in which falling is  common.  Poor strength and flexibility.  Improperly fitted or padded protective equipment. PREVENTION  Warm up and stretch properly before activity.  Allow for adequate recovery between workouts.  Maintain physical fitness:  Strength, flexibility, and endurance.  Cardiovascular fitness.  Protect the wrist joint by limiting its motion with the use of taping, braces, or splints.  Protect the wrist after injury for 6 to 12 months. PROGNOSIS  The prognosis for wrist sprains depends on the degree of injury. Grade 1 sprains require 2 to 6 weeks of treatment. Grade 2 sprains require 6 to 8 weeks of treatment, and grade 3 sprains require up to 12 weeks.  RELATED COMPLICATIONS   Prolonged healing time, if improperly treated or re-injured.  Recurrent symptoms that result in a chronic problem.  Injury to nearby structures (bone, cartilage, nerves, or tendons).  Arthritis of the wrist.  Inability to compete in athletics at a high level.  Wrist stiffness or weakness.  Progression to a complete rupture of the ligament. TREATMENT  Treatment initially involves resting from any activities that aggravate the symptoms, and the use of ice and medications to help reduce pain and inflammation. Your caregiver may recommend immobilizing the wrist for a period of time in order to reduce stress on the ligament and allow for healing. After immobilization it is important to perform strengthening and stretching exercises to help regain strength and a full range of motion. These exercises may be completed at home or with a therapist. Surgery is not usually required for wrist sprains, unless the ligament has been ruptured (grade 3 sprain). MEDICATION  If pain medication is necessary, then nonsteroidal anti-inflammatory medications, such as aspirin and ibuprofen, or other minor pain relievers, such as acetaminophen, are often recommended.  Do not take pain medication for 7 days before  surgery.  Prescription pain relievers may be given if deemed necessary by your caregiver. Use only as directed and only as much as you need. HEAT AND COLD  Cold treatment (icing) relieves pain and reduces inflammation. Cold treatment should be applied for 10 to 15 minutes every 2 to 3 hours for inflammation and pain and immediately after any activity that aggravates your symptoms. Use ice packs or massage the area with a piece of ice (ice massage).  Heat treatment may be used prior to performing the stretching and strengthening activities prescribed by your caregiver, physical therapist, or athletic trainer. Use a heat pack or soak your injury in warm water. SEEK MEDICAL CARE IF:  Treatment seems to offer no benefit, or the condition worsens.  Any medications produce adverse side effects. EXERCISES RANGE OF MOTION (ROM) AND STRETCHING EXERCISES - Wrist Sprain  These exercises may help you when beginning to rehabilitate your injury. Your symptoms may resolve with or without further involvement from your physician, physical therapist or athletic trainer. While completing these exercises, remember:   Restoring tissue flexibility helps normal motion to return to the joints. This allows healthier, less painful movement and activity.  An effective stretch should be held for at least 30 seconds.  A stretch should never be painful. You should only feel a gentle lengthening or release in the stretched tissue. RANGE OF MOTION - Wrist Flexion, Active-Assisted  Extend your right / left elbow with your fingers pointing down.*  Gently pull the back of your hand towards you until you feel a gentle stretch on the top of your forearm.  Hold this position for __________ seconds. Repeat __________ times. Complete this exercise __________ times per day.  *If directed by your physician, physical therapist or athletic trainer, complete this stretch with your elbow bent rather than extended. RANGE OF MOTION  - Wrist Extension, Active-Assisted  Extend your right / left elbow and turn your palm upwards.*  Gently pull your palm/fingertips back so your wrist extends and your fingers point more toward the ground.  You should feel a gentle stretch on the inside of your forearm.  Hold this position for __________ seconds. Repeat __________ times. Complete this exercise __________ times per day. *If directed by your physician, physical therapist or athletic trainer, complete this stretch with your elbow bent, rather than extended. RANGE OF MOTION - Supination, Active  Stand or sit with your elbows at your side. Bend your right / left elbow to 90 degrees.  Turn your palm upward until you feel a gentle stretch on the inside of your forearm.  Hold this position for __________ seconds. Slowly release and return to the starting position. Repeat __________ times. Complete this stretch __________ times per day.  RANGE OF MOTION - Pronation, Active  Stand or sit with your elbows at your side. Bend your right / left elbow to 90 degrees.  Turn your palm downward until you feel a gentle stretch on the top of your forearm.  Hold this position for __________ seconds. Slowly release and return to the starting position. Repeat __________ times. Complete this stretch __________ times per day.  STRETCH - Wrist Flexion  Place the back of your right / left hand on a tabletop leaving your elbow slightly bent. Your fingers should point away from  your body.  Gently press the back of your hand down onto the table by straightening your elbow. You should feel a stretch on the top of your forearm.  Hold this position for __________ seconds. Repeat __________ times. Complete this stretch __________ times per day.  STRETCH - Wrist Extension  Place your right / left fingertips on a tabletop leaving your elbow slightly bent. Your fingers should point backwards.  Gently press your fingers and palm down onto the table by  straightening your elbow. You should feel a stretch on the inside of your forearm.  Hold this position for __________ seconds. Repeat __________ times. Complete this stretch __________ times per day.  STRENGTHENING EXERCISES - Wrist Sprain These exercises may help you when beginning to rehabilitate your injury. They may resolve your symptoms with or without further involvement from your physician, physical therapist or athletic trainer. While completing these exercises, remember:   Muscles can gain both the endurance and the strength needed for everyday activities through controlled exercises.  Complete these exercises as instructed by your physician, physical therapist or athletic trainer. Progress with the resistance and repetition exercises only as your caregiver advises. STRENGTH - Wrist Flexors  Sit with your right / left forearm palm-up and fully supported. Your elbow should be resting below the height of your shoulder. Allow your wrist to extend over the edge of the surface.  Loosely holding a __________ weight or a piece of rubber exercise band/tubing, slowly curl your hand up toward your forearm.  Hold this position for __________ seconds. Slowly lower the wrist back to the starting position in a controlled manner. Repeat __________ times. Complete this exercise __________ times per day.  STRENGTH - Wrist Extensors  Sit with your right / left forearm palm-down and fully supported. Your elbow should be resting below the height of your shoulder. Allow your wrist to extend over the edge of the surface.  Loosely holding a __________ weight or a piece of rubber exercise band/tubing, slowly curl your hand up toward your forearm.  Hold this position for __________ seconds. Slowly lower the wrist back to the starting position in a controlled manner. Repeat __________ times. Complete this exercise __________ times per day.  STRENGTH - Ulnar Deviators  Stand with a ____________________  weight in your right / left hand, or sit holding on to the rubber exercise band/tubing with your opposite arm supported.  Move your wrist so that your pinkie travels toward your forearm and your thumb moves away from your forearm.  Hold this position for __________ seconds and then slowly lower the wrist back to the starting position. Repeat __________ times. Complete this exercise __________ times per day STRENGTH - Radial Deviators  Stand with a ____________________ weight in your  right / left hand, or sit holding on to the rubber exercise band/tubing with your arm supported.  Raise your hand upward in front of you or pull up on the rubber tubing.  Hold this position for __________ seconds and then slowly lower the wrist back to the starting position. Repeat __________ times. Complete this exercise __________ times per day. STRENGTH - Forearm Supinators  Sit with your right / left forearm supported on a table, keeping your elbow below shoulder height. Rest your hand over the edge, palm down.  Gently grip a hammer or a soup ladle.  Without moving your elbow, slowly turn your palm and hand upward to a "thumbs-up" position.  Hold this position for __________ seconds. Slowly return to the starting position.  Repeat __________ times. Complete this exercise __________ times per day.  STRENGTH - Forearm Pronators  Sit with your right / left forearm supported on a table, keeping your elbow below shoulder height. Rest your hand over the edge, palm up.  Gently grip a hammer or a soup ladle.  Without moving your elbow, slowly turn your palm and hand upward to a "thumbs-up" position.  Hold this position for __________ seconds. Slowly return to the starting position. Repeat __________ times. Complete this exercise __________ times per day.  STRENGTH - Grip  Grasp a tennis ball, a dense sponge, or a large, rolled sock in your hand.  Squeeze as hard as you can without increasing any  pain.  Hold this position for __________ seconds. Release your grip slowly. Repeat __________ times. Complete this exercise __________ times per day.    This information is not intended to replace advice given to you by your health care provider. Make sure you discuss any questions you have with your health care provider.   Document Released: 08/14/2005 Document Revised: 05/05/2015 Document Reviewed: 11/26/2008 Elsevier Interactive Patient Education 2016 [employer].   Your exam is consistent with thumb tendinitis and wrist sprain. You appear to have some overuse of the forearm muscles as well. Wear the splint while working. Apply ice to reduce symptoms. Follow-up with Dr. Tobin Chad for ongoing symptoms.

## 2015-09-29 NOTE — ED Provider Notes (Signed)
[facility] Emergency Department Provider Note ____________________________________________  Time seen: 1945  I have reviewed the triage vital signs and the nursing notes.  HISTORY  Chief Complaint  Wrist Pain   HPI Edward Soto is a 40 y.o. male presents to the ED for evaluation of pain to the right wrist after he remembers helping of friend move some furniture last week. He denies any outright injury, trauma, fall, or sprain to the right wrist. He also denies any appreciable swelling, bruising, or grip changes. He also localizes some pain to the base of the right thumb as well as some tenderness to the medial elbow. He does not have a history of carpal tunnel syndrome, thumb tendinitis, or epicondylitis. He rates his overall discomfort at a 3/10 in triage.  Past Medical History  Diagnosis Date  . H/O seasonal allergies   . DVT (deep venous thrombosis) (HCC)     right leg  . Depression     There are no active problems to display for this patient.   Past Surgical History  Procedure Laterality Date  . Knee arthroscopy Right   . Fracture surgery Right   . 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 buPROPion (WELLBUTRIN XL) 150 MG 24 hr tablet   Oral   Take 150 mg by mouth daily.         . clonazePAM (KLONOPIN) 0.5 MG tablet   Oral   Take 0.5 mg by mouth 2 (two) times daily as needed for anxiety.         Marland Kitchen FLUoxetine (PROZAC) 10 MG capsule   Oral   Take 10 mg by mouth daily.         . zaleplon (SONATA) 10 MG capsule   Oral   Take 10 mg by mouth at bedtime as needed for sleep.         Marland Kitchen HYDROcodone-acetaminophen (NORCO/VICODIN) 5-325 MG per tablet   Oral   Take 1 tablet by mouth every 4 (four) hours as needed for  moderate pain.   20 tablet   0   . ibuprofen (ADVIL,MOTRIN) 800 MG tablet   Oral   Take 1 tablet (800 mg total) by mouth 3 (three) times daily.   30 tablet   0    Allergies Review of patient's allergies indicates no known allergies.  No family history on file.  Social History Social History  Substance Use Topics  . Smoking status: Current Some Day Smoker -- 17 years  . Smokeless tobacco: None  . Alcohol Use: No   Review of Systems  Constitutional: Negative for fever. Eyes: Negative for visual changes. ENT: Negative for sore throat. Cardiovascular: Negative for chest pain. Respiratory: Negative for shortness of breath. Gastrointestinal: Negative for abdominal pain, vomiting and diarrhea. Genitourinary: Negative for dysuria. Musculoskeletal: Negative for back pain. Right thumb and wrist pain as above. Skin: Negative for rash. Neurological: Negative for headaches, focal weakness or numbness. ____________________________________________  PHYSICAL EXAM:  VITAL SIGNS: ED Triage Vitals  Enc Vitals Group     BP 09/27/15 1843 149/95 mmHg     Pulse Rate 09/27/15 1843 88     Resp 09/27/15 1843 20     Temp 09/27/15 1843 97.7 F (36.5 C)  Temp Source 09/27/15 1843 Oral     SpO2 09/27/15 1843 96 %     Weight 09/27/15 1843 366 lb (166.017 kg)     Height 09/27/15 1843 6\' 2"  (1.88 m)     Head Cir --      Peak Flow --      Pain Score 09/27/15 2029 3     Pain Loc --      Pain Edu? --      Excl. in [place]? --    Constitutional: Alert and oriented. Well appearing and in no distress. Head: Normocephalic and atraumatic.      Eyes: Conjunctivae are normal. PERRL. Normal extraocular movements      Ears: Canals clear. TMs intact bilaterally.   Nose: No congestion/rhinorrhea.   Mouth/Throat: Mucous membranes are moist.   Neck: Supple. No thyromegaly. Hematological/Lymphatic/Immunological: No cervical lymphadenopathy. Cardiovascular: Normal rate, regular rhythm.   Respiratory: Normal respiratory effort. No wheezes/rales/rhonchi. Gastrointestinal: Soft and nontender. No distention. Musculoskeletal: Patient without any deformity, edema, or dislocation to the right hand and wrist. He is able to infiltrate and normal composite fist. He has a negative Finkelstein on exam. He is able to show full pronation and supination at the forearm. He is minimally tender to palpation to the medial epicondyle. Full range of motion to the wrist, elbow, and shoulder including normal resistance testing without deficit. Nontender with normal range of motion in all extremities.  Neurologic:  Cranial nerves II through XII are grossly intact. Normal intrinsic and opposition testing is noted. Patient with a negative carpal and cubital Tinel's sign. Normal gait without ataxia. Normal speech and language. No gross focal neurologic deficits are appreciated. Skin:  Skin is warm, dry and intact. No rash noted. Psychiatric: Mood and affect are normal. Patient exhibits appropriate insight and judgment. ____________________________________________   RADIOLOGY deferred ____________________________________________  PROCEDURES  Wrist cock-up splint ____________________________________________  INITIAL IMPRESSION / ASSESSMENT AND PLAN / ED COURSE  Patient with a clinical presentation for a likely wrist sprain, with a mild thumb tendinitis. He'll be discharged with a wrist cock-up splint for support. He is also advised to dose Aleve daily for symptom relief. He will be provided with a work note a lot of for the use of the splint for the next week. He will follow up with orthopedics for ongoing symptom management. ____________________________________________  FINAL CLINICAL IMPRESSION(S) / ED DIAGNOSES  Final diagnoses:  Wrist sprain, right, initial encounter  Forearm strain, right, initial encounter  Tendinitis of thumb      Melvenia Needles, PA-C 09/29/15 0100  Delman Kitten, MD 10/02/15 0041

## 2016-01-26 ENCOUNTER — Emergency Department
Admission: EM | Admit: 2016-01-26 | Discharge: 2016-01-26 | Disposition: A | Discharge status: Home / Self Care | Payer: BLUE CROSS/BLUE SHIELD | Attending: Emergency Medicine | Admitting: Emergency Medicine

## 2016-01-26 ENCOUNTER — Emergency Department: Payer: BLUE CROSS/BLUE SHIELD

## 2016-01-26 DIAGNOSIS — Z86718 Personal history of other venous thrombosis and embolism: Secondary | ICD-10-CM | POA: Diagnosis not present

## 2016-01-26 DIAGNOSIS — R0989 Other specified symptoms and signs involving the circulatory and respiratory systems: Secondary | ICD-10-CM | POA: Insufficient documentation

## 2016-01-26 DIAGNOSIS — Z79899 Other long term (current) drug therapy: Secondary | ICD-10-CM | POA: Insufficient documentation

## 2016-01-26 DIAGNOSIS — F17200 Nicotine dependence, unspecified, uncomplicated: Secondary | ICD-10-CM | POA: Insufficient documentation

## 2016-01-26 DIAGNOSIS — R112 Nausea with vomiting, unspecified: Secondary | ICD-10-CM | POA: Diagnosis not present

## 2016-01-26 DIAGNOSIS — R197 Diarrhea, unspecified: Secondary | ICD-10-CM | POA: Diagnosis not present

## 2016-01-26 DIAGNOSIS — R42 Dizziness and giddiness: Secondary | ICD-10-CM | POA: Diagnosis present

## 2016-01-26 DIAGNOSIS — F329 Major depressive disorder, single episode, unspecified: Secondary | ICD-10-CM | POA: Diagnosis not present

## 2016-01-26 DIAGNOSIS — Z791 Long term (current) use of non-steroidal anti-inflammatories (NSAID): Secondary | ICD-10-CM | POA: Diagnosis not present

## 2016-01-26 LAB — COMPREHENSIVE METABOLIC PANEL
ALT: 39 U/L (ref 17–63)
ANION GAP: 8 (ref 5–15)
AST: 29 U/L (ref 15–41)
Albumin: 4.4 g/dL (ref 3.5–5.0)
Alkaline Phosphatase: 59 U/L (ref 38–126)
BUN: 27 mg/dL — ABNORMAL HIGH (ref 6–20)
CALCIUM: 9.1 mg/dL (ref 8.9–10.3)
CHLORIDE: 107 mmol/L (ref 101–111)
CO2: 26 mmol/L (ref 22–32)
CREATININE: 1.15 mg/dL (ref 0.61–1.24)
Glucose, Bld: 99 mg/dL (ref 65–99)
Potassium: 3.9 mmol/L (ref 3.5–5.1)
SODIUM: 141 mmol/L (ref 135–145)
Total Bilirubin: 0.3 mg/dL (ref 0.3–1.2)
Total Protein: 6.8 g/dL (ref 6.5–8.1)

## 2016-01-26 LAB — CBC
HCT: 42 % (ref 40.0–52.0)
Hemoglobin: 14.6 g/dL (ref 13.0–18.0)
MCH: 32.4 pg (ref 26.0–34.0)
MCHC: 34.8 g/dL (ref 32.0–36.0)
MCV: 93 fL (ref 80.0–100.0)
PLATELETS: 171 10*3/uL (ref 150–440)
RBC: 4.51 MIL/uL (ref 4.40–5.90)
RDW: 12.8 % (ref 11.5–14.5)
WBC: 6.1 10*3/uL (ref 3.8–10.6)

## 2016-01-26 LAB — URINALYSIS COMPLETE WITH MICROSCOPIC (ARMC ONLY)
Bacteria, UA: NONE SEEN
Bilirubin Urine: NEGATIVE
GLUCOSE, UA: NEGATIVE mg/dL
HGB URINE DIPSTICK: NEGATIVE
KETONES UR: NEGATIVE mg/dL
Leukocytes, UA: NEGATIVE
NITRITE: NEGATIVE
Protein, ur: NEGATIVE mg/dL
RBC / HPF: NONE SEEN RBC/hpf (ref 0–5)
SPECIFIC GRAVITY, URINE: 1.028 (ref 1.005–1.030)
Squamous Epithelial / LPF: NONE SEEN
pH: 5 (ref 5.0–8.0)

## 2016-01-26 LAB — TROPONIN I

## 2016-01-26 MED ORDER — ONDANSETRON HCL 4 MG PO TABS: 4.0000 mg | ORAL_TABLET | Freq: Three times a day (TID) | ORAL | Status: AC | PRN

## 2016-01-26 MED ORDER — LOPERAMIDE HCL 2 MG PO TABS: 2.0000 mg | ORAL_TABLET | Freq: Four times a day (QID) | ORAL | Status: DC | PRN

## 2016-01-26 MED ORDER — ONDANSETRON HCL 4 MG PO TABS: 4.0000 mg | ORAL_TABLET | Freq: Every day | ORAL | Status: DC | PRN

## 2016-01-26 NOTE — ED Notes (Signed)
Patient tolerated ginger ale and crackers well. 

## 2016-01-26 NOTE — ED Notes (Signed)
Patient given ginger ale and crackers. 

## 2016-01-26 NOTE — ED Provider Notes (Signed)
[facility] Emergency Department Provider Note  ____________________________________________  Time seen: Approximately 9:25 PM  I have reviewed the triage vital signs and the nursing notes.   HISTORY  Chief Complaint Dizziness    HPI Edward Soto is a 40 y.o. male with a remote history of DVT not currently anticoagulated, obesity, presenting with lightheadedness. The patient reports thathe was doing a significant amount of exertional work in the heat yesterday when he began to have episodes of lightheadedness associated with several episodes of nausea vomiting and nonbloody diarrhea. He had another episode this morning after eating breakfast at 4 AM. Since then he has had lightheadedness without dizziness. No fever, chills, abdominal pain, known sick contacts. At this time, he is no longer nauseated and has not recently had any diarrhea, but continues to be lightheaded with positional changes.   Past Medical History  Diagnosis Date  . H/O seasonal allergies   . DVT (deep venous thrombosis) (HCC)     right leg  . Depression     There are no active problems to display for this patient.   Past Surgical History  Procedure Laterality Date  . Knee arthroscopy Right   . Fracture surgery Right   . 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 buPROPion (WELLBUTRIN XL) 150 MG 24 hr tablet   Oral   Take 150 mg by mouth daily.         . clonazePAM (KLONOPIN) 0.5 MG tablet   Oral   Take 0.5 mg by mouth 2 (two) times daily as needed for anxiety.         Marland Kitchen FLUoxetine (PROZAC) 10 MG capsule   Oral   Take 10 mg by mouth daily.         Marland Kitchen HYDROcodone-acetaminophen (NORCO/VICODIN) 5-325 MG per tablet   Oral   Take 1 tablet by mouth every  4 (four) hours as needed for moderate pain.   20 tablet   0   . ibuprofen (ADVIL,MOTRIN) 800 MG tablet   Oral   Take 1 tablet (800 mg total) by mouth 3 (three) times daily.   30 tablet   0   . loperamide (IMODIUM A-D) 2 MG tablet   Oral   Take 1 tablet (2 mg total) by mouth 4 (four) times daily as needed for diarrhea or loose stools.   12 tablet   0   . ondansetron (ZOFRAN) 4 MG tablet   Oral   Take 1 tablet (4 mg total) by mouth every 8 (eight) hours as needed for nausea or vomiting.   12 tablet   0   . zaleplon (SONATA) 10 MG capsule   Oral   Take 10 mg by mouth at bedtime as needed for sleep.           Allergies Review of patient's allergies indicates no known allergies.  No family history on file.  Social History Social History  Substance Use Topics  . Smoking status: Current Some Day Smoker -- 17 years  . Smokeless tobacco: Not on file  . Alcohol Use: No    Review of Systems Constitutional: No fever/chills.Positive positional lightheadedness. Negative syncope. Eyes: No visual changes. No blurred or double vision. ENT: No sore throat. No  congestion or rhinorrhea. Cardiovascular: Denies chest pain. Denies palpitations. Respiratory: Denies shortness of breath.  No cough. Gastrointestinal: No abdominal pain.  Positive nausea, positive vomiting.  Positive diarrhea.  No constipation. Genitourinary: Negative for dysuria. Musculoskeletal: Negative for back pain. Skin: Negative for rash. Neurological: Negative for headaches. No focal numbness, tingling or weakness.  Psychiatric:Positive anxiety  10-point ROS otherwise negative.  ____________________________________________   PHYSICAL EXAM:  VITAL SIGNS: ED Triage Vitals  Enc Vitals Group     BP 01/26/16 2019 151/91 mmHg     Pulse Rate 01/26/16 2019 85     Resp 01/26/16 2019 18     Temp 01/26/16 2019 98.4 F (36.9 C)     Temp Source 01/26/16 2019 Oral     SpO2 01/26/16 2019 95 %     Weight 01/26/16  2019 371 lb (168.284 kg)     Height 01/26/16 2019 6\' 2"  (1.88 m)     Head Cir --      Peak Flow --      Pain Score 01/26/16 2024 4     Pain Loc --      Pain Edu? --      Excl. in [place]? --     Constitutional: Alert and oriented. Well appearing and in no acute distress. Answers questions appropriately. Eyes: Conjunctivae are normal.  EOMI. No scleral icterus. Head: Atraumatic. Nose: No congestion/rhinnorhea. Mouth/Throat: Mucous membranes are moist.  Neck: No stridor.  Supple.  No JVD. No meningismus. Cardiovascular: Normal rate, regular rhythm. No murmurs, rubs or gallops.  Respiratory: Normal respiratory effort.  No accessory muscle use or retractions. Lungs CTAB.  No wheezes, rales or ronchi. Gastrointestinal: Obese. Soft, nontender and nondistended.  No guarding or rebound.  No peritoneal signs. Musculoskeletal: No LE edema. No ttp in the calves or palpable cords.  Negative Homan's sign. Neurologic:  A&Ox3.  Speech is clear.  Face and smile are symmetric.  EOMI.  Moves all extremities well. Skin:  Skin is warm, dry and intact. No rash noted. Psychiatric: Mood and affect are normal. Speech and behavior are normal.  Normal judgement.  ____________________________________________   LABS (all labs ordered are listed, but only abnormal results are displayed)  Labs Reviewed  COMPREHENSIVE METABOLIC PANEL - Abnormal; Notable for the following:    BUN 27 (*)    All other components within normal limits  URINALYSIS COMPLETEWITH MICROSCOPIC ([facility] ONLY) - Abnormal; Notable for the following:    Color, Urine YELLOW (*)    APPearance CLEAR (*)    All other components within normal limits  CBC  TROPONIN I   ____________________________________________  EKG  ED ECG REPORT I, Eula Listen, the attending physician, personally viewed and interpreted this ECG.   Date: 01/26/2016  EKG Time: 2026  Rate: 81  Rhythm: normal sinus rhythm  Axis: normal  Intervals:none  ST&T  Change: No ST changes  ____________________________________________  RADIOLOGY  Dg Chest 2 View  01/26/2016  CLINICAL DATA:  Acute onset of dizziness. Diarrhea and right lower back pain. Initial encounter. EXAM: CHEST  2 VIEW COMPARISON:  Chest radiograph performed 09/23/2013 FINDINGS: The lungs are well-aerated. Vascular congestion is noted. Peribronchial thickening is seen. There is no evidence of pleural effusion or pneumothorax. The heart is borderline normal in size. No acute osseous abnormalities are seen. IMPRESSION: Vascular congestion noted.  Peribronchial thickening seen. Electronically Signed   By: Garald Balding M.D.   On: 01/26/2016 21:34    ____________________________________________   PROCEDURES  Procedure(s) performed: None  Critical  Care performed: No ____________________________________________   INITIAL IMPRESSION / ASSESSMENT AND PLAN / ED COURSE  Pertinent labs & imaging results that were available during my care of the patient were reviewed by me and considered in my medical decision making (see chart for details).  40 y.o. male presenting with nausea vomiting and diarrhea, associated with positional lightheadedness, onset after significant exertion and heat. It is possible the patient has heat-related illness, negative hydrated resulting in his orthostasis. His vital signs are reassuring and he has no evidence of an acute intra-abdominal pathology. It is unlikely that he has an acute cardiac abnormality causing his symptoms. We'll get orthostatics, and basic labs, as well as a chest x-ray. If he is orthostatic, we'll place an IV for IV fluids, otherwise would do oral rehydration for his symptoms. At this time, the patient is no longer nauseated and should be able to tolerate by mouth.  ----------------------------------------- 10:31 PM on 01/26/2016 -----------------------------------------  The patient's lab work here is reassuring and his orthostatics are  negative. He is able to tolerate by mouth and is feeling better at this time. I'll plan to discharge him home with close PMD follow-up. He mentions return precautions as well as follow-up instructions.  ____________________________________________  FINAL CLINICAL IMPRESSION(S) / ED DIAGNOSES  Final diagnoses:  Pulmonary vascular congestion  Lightheadedness  Nausea vomiting and diarrhea      NEW MEDICATIONS STARTED DURING THIS VISIT:  New Prescriptions   LOPERAMIDE (IMODIUM A-D) 2 MG TABLET    Take 1 tablet (2 mg total) by mouth 4 (four) times daily as needed for diarrhea or loose stools.   ONDANSETRON (ZOFRAN) 4 MG TABLET    Take 1 tablet (4 mg total) by mouth every 8 (eight) hours as needed for nausea or vomiting.     Eula Listen, MD 01/26/16 2231

## 2016-01-26 NOTE — ED Notes (Signed)
Pt in with co dizziness since yest, has had vomiting since. Also co diarrhea, right lower back pain, denies any dysuria. Pt denies any cp or shob.

## 2016-01-26 NOTE — Discharge Instructions (Signed)
Please drink plenty of fluid to stay well-hydrated. Please follow up bland BRAT diet prevent vomiting and diarrhea.  Return to the emergency department if you develop palpitations, chest pain, lightheadedness or fainting, inability to keep down fluids, fever, abdominal pain, or any other symptoms concerning to you.

## 2016-03-16 ENCOUNTER — Emergency Department: Payer: BLUE CROSS/BLUE SHIELD

## 2016-03-16 DIAGNOSIS — M25561 Pain in right knee: Secondary | ICD-10-CM | POA: Insufficient documentation

## 2016-03-16 DIAGNOSIS — F17200 Nicotine dependence, unspecified, uncomplicated: Secondary | ICD-10-CM | POA: Diagnosis not present

## 2016-03-16 DIAGNOSIS — Z86718 Personal history of other venous thrombosis and embolism: Secondary | ICD-10-CM | POA: Diagnosis not present

## 2016-03-16 DIAGNOSIS — Z791 Long term (current) use of non-steroidal anti-inflammatories (NSAID): Secondary | ICD-10-CM | POA: Insufficient documentation

## 2016-03-16 NOTE — ED Notes (Addendum)
Pt to triage via w/c with no distress noted; reports right knee pain x 3 days with no known injury; st hx arthroscopy same knee; st pain increases with wt bearing; denies any radiating pain

## 2016-03-17 ENCOUNTER — Emergency Department: Payer: BLUE CROSS/BLUE SHIELD

## 2016-03-17 ENCOUNTER — Emergency Department
Admission: EM | Admit: 2016-03-17 | Discharge: 2016-03-17 | Disposition: A | Discharge status: Home / Self Care | Payer: BLUE CROSS/BLUE SHIELD | Attending: Emergency Medicine | Admitting: Emergency Medicine

## 2016-03-17 DIAGNOSIS — M25561 Pain in right knee: Secondary | ICD-10-CM

## 2016-03-17 NOTE — ED Provider Notes (Signed)
[facility] Emergency Department Provider Note  ____________________________________________  Time seen: Approximately 1:40 AM  I have reviewed the triage vital signs and the nursing notes.   HISTORY  Chief Complaint Knee Pain   HPI Edward Soto is a 40 y.o. male with a history of a right lower extremity DVT as well as a arthroscopy of the right knee when he was 34 was presenting with right knee pain. He says that the pain is been worsening over the past 3 days. He says that it is both anterior and posterior to the right knee. He denies any known injury but says he was chasing his grandchildren around several days ago and thinks he may have overexerted himself. He said that the surgery of the right knee in the past was due to a meniscus injury secondary to a basketball injury. He says that the pain is laterally and anterior and is worse with ambulation. He has been using ibuprofen which has been helping with his pain. He is unsure how he got a DVT in the past. He says he was on Coumadin for. Time but is no longer taking any medications.Says that he also feels a popping noise in the knee with ambulation and bending of the knee.   Past Medical History  Diagnosis Date  . H/O seasonal allergies   . DVT (deep venous thrombosis) (HCC)     right leg  . Depression     There are no active problems to display for this patient.   Past Surgical History  Procedure Laterality Date  . Knee arthroscopy Right   . Fracture surgery Right   . 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 buPROPion (WELLBUTRIN XL) 150 MG 24 hr tablet   Oral   Take 150 mg by mouth daily.         . clonazePAM (KLONOPIN) 0.5 MG tablet   Oral   Take 0.5 mg by mouth 2  (two) times daily as needed for anxiety.         Marland Kitchen FLUoxetine (PROZAC) 10 MG capsule   Oral   Take 10 mg by mouth daily.         Marland Kitchen HYDROcodone-acetaminophen (NORCO/VICODIN) 5-325 MG per tablet   Oral   Take 1 tablet by mouth every 4 (four) hours as needed for moderate pain.   20 tablet   0   . ibuprofen (ADVIL,MOTRIN) 800 MG tablet   Oral   Take 1 tablet (800 mg total) by mouth 3 (three) times daily.   30 tablet   0   . loperamide (IMODIUM A-D) 2 MG tablet   Oral   Take 1 tablet (2 mg total) by mouth 4 (four) times daily as needed for diarrhea or loose stools.   12 tablet   0   . ondansetron (ZOFRAN) 4 MG tablet   Oral   Take 1 tablet (4 mg total) by mouth every 8 (eight) hours as needed for nausea or vomiting.   12 tablet   0   . zaleplon (SONATA) 10 MG capsule   Oral   Take 10 mg by mouth at bedtime as needed for sleep.           Allergies Review  of patient's allergies indicates no known allergies.  No family history on file.  Social History Social History  Substance Use Topics  . Smoking status: Current Some Day Smoker -- 17 years  . Smokeless tobacco: Not on file  . Alcohol Use: No    Review of Systems Constitutional: No fever/chills Eyes: No visual changes. ENT: No sore throat. Cardiovascular: Denies chest pain. Respiratory: Denies shortness of breath. Gastrointestinal: No abdominal pain.  No nausea, no vomiting.  No diarrhea.  No constipation. Genitourinary: Negative for dysuria. Musculoskeletal: Negative for back pain. Skin: Negative for rash. Neurological: Negative for headaches, focal weakness or numbness.  10-point ROS otherwise negative.  ____________________________________________   PHYSICAL EXAM:  VITAL SIGNS: ED Triage Vitals  Enc Vitals Group     BP 03/16/16 2153 137/80 mmHg     Pulse Rate 03/16/16 2153 91     Resp 03/17/16 0128 18     Temp 03/16/16 2153 98.2 F (36.8 C)     Temp Source 03/16/16 2153 Oral     SpO2  03/16/16 2153 96 %     Weight 03/16/16 2153 369 lb (167.377 kg)     Height 03/16/16 2153 6\' 2"  (1.88 m)     Head Cir --      Peak Flow --      Pain Score 03/16/16 2156 7     Pain Loc --      Pain Edu? --      Excl. in [place]? --     Constitutional: Alert and oriented. Well appearing and in no acute distress. Eyes: Conjunctivae are normal. PERRL. EOMI. Head: Atraumatic. Nose: No congestion/rhinnorhea. Mouth/Throat: Mucous membranes are moist.   Neck: No stridor.   Cardiovascular: Normal rate, regular rhythm. Grossly normal heart sounds.  Intact, equal and bilateral dorsalis pedis pulses. Respiratory: Normal respiratory effort.  No retractions. Lungs CTAB. Gastrointestinal: Soft and nontender. No distention. No abdominal bruits. No CVA tenderness. Musculoskeletal: Mild edema to bilateral ankles which the patient says is his baseline. Anterior tenderness to palpation just lateral to the patella of the right knee. No obvious effusion to the right knee. Also with tenderness posteriorly. No erythema, swelling or warmth. No ligamentous laxity. Neurologic:  Normal speech and language. No gross focal neurologic deficits are appreciated. Patient is able to ambulate but with a limp. Skin:  Skin is warm, dry and intact. No rash noted. Psychiatric: Mood and affect are normal. Speech and behavior are normal.  ____________________________________________   LABS (all labs ordered are listed, but only abnormal results are displayed)  Labs Reviewed - No data to display ____________________________________________  EKG   ____________________________________________  RADIOLOGY     DG Knee Complete 4 Views Right (Final result) Result time: 03/17/16 00:17:10   Final result by Rad Results In Interface (03/17/16 00:17:10)   Narrative:   CLINICAL DATA: Knee pain for 3 days with no known injury.  EXAM: RIGHT KNEE - COMPLETE 4+ VIEW  COMPARISON: None available  FINDINGS: No convincing  joint effusion. No fracture, subluxation, or joint narrowing.  IMPRESSION: Negative.   Electronically Signed By: Monte Fantasia M.D. On: 03/17/2016 00:17   US Venous Img Lower Unilateral Right (Final result) Result time: 03/17/16 02:57:46   Final result by Rad Results In Interface (03/17/16 02:57:46)   Narrative:   CLINICAL DATA: Right posterior knee pain. History DVT.  EXAM: RIGHT LOWER EXTREMITY VENOUS DOPPLER ULTRASOUND  TECHNIQUE: Gray-scale sonography with graded compression, as well as color Doppler and duplex ultrasound were performed to evaluate the  lower extremity deep venous systems from the level of the common femoral vein and including the common femoral, femoral, profunda femoral, popliteal and calf veins including the posterior tibial, peroneal and gastrocnemius veins when visible. The superficial great saphenous vein was also interrogated. Spectral Doppler was utilized to evaluate flow at rest and with distal augmentation maneuvers in the common femoral, femoral and popliteal veins.  COMPARISON: None.  FINDINGS: Contralateral Common Femoral Vein: Respiratory phasicity is normal and symmetric with the symptomatic side. No evidence of thrombus. Normal compressibility.  Common Femoral Vein: No evidence of thrombus. Normal compressibility, respiratory phasicity and response to augmentation.  Saphenofemoral Junction: No evidence of thrombus. Normal compressibility and flow on color Doppler imaging.  Profunda Femoral Vein: No evidence of thrombus. Normal compressibility and flow on color Doppler imaging.  Femoral Vein: No evidence of thrombus. Normal compressibility, respiratory phasicity and response to augmentation.  Popliteal Vein: No evidence of thrombus. Normal compressibility, respiratory phasicity and response to augmentation.  Calf Veins: No evidence of thrombus. Normal compressibility and flow on color Doppler imaging.  Superficial  Great Saphenous Vein: No evidence of thrombus. Normal compressibility and flow on color Doppler imaging.  Venous Reflux: None.  Other Findings: There is a 1.9 x 1.0 x 0.8 cm right Baker's cyst.  IMPRESSION: No evidence of deep venous thrombosis.  Small right Child psychotherapist cyst.   Electronically Signed By: Dorise Bullion III M.D On: 03/17/2016 02:57     ____________________________________________   PROCEDURES  Procedures  ____________________________________________   INITIAL IMPRESSION / ASSESSMENT AND PLAN / ED COURSE  Pertinent labs & imaging results that were available during my care of the patient were reviewed by me and considered in my medical decision making (see chart for details).  Patient with history consistent with a musculoskeletal injury although he said when he was diagnosed with a blood clot that he thought it was a muscle pull. We'll proceed with ultrasound. A field sign is negative for DVT then the patient will follow-up with orthopedics.  ----------------------------------------- 3:12 AM on 03/17/2016 -----------------------------------------  Patient without evidence of DVT on ultrasound. Suspecting meniscus injury such as bucket handle tear due to knee locking. Patient will be following up with orthopedics. I feel that a full immobilization at this time would be detrimental. The patient is able to ambulate albeit with a limp. However, with his risk of DVT in the past I feel that immobilization this time would pose significant risk. Patient understands the plan and follow-up with orthopedics and is willing to comply. Will be discharged home. ____________________________________________   FINAL CLINICAL IMPRESSION(S) / ED DIAGNOSES  Right knee pain.    NEW MEDICATIONS STARTED DURING THIS VISIT:  New Prescriptions   No medications on file     Note:  This document was prepared using Dragon voice recognition software and may include  unintentional dictation errors.    Orbie Pyo, MD 03/17/16 [phone]

## 2016-03-17 NOTE — ED Notes (Signed)
Pt taken to US via wheelchair

## 2016-03-17 NOTE — ED Notes (Signed)
Pt returned from U/S via wheelchair.

## 2016-03-17 NOTE — ED Notes (Addendum)
Pt states he does not know if he injured R knee but states a few days ago running around with grandchildren. Pt is in wheelchair. Pt states knee "will pop like you're cracking your fingers but louder". Pt reports taking ibuprofen which knocks the pain a little bit. Hx of R knee surgery when he was a teenager.

## 2016-03-17 NOTE — Discharge Instructions (Signed)

## 2016-04-03 ENCOUNTER — Emergency Department
Admission: EM | Admit: 2016-04-03 | Discharge: 2016-04-03 | Disposition: A | Discharge status: Home / Self Care | Payer: BLUE CROSS/BLUE SHIELD | Attending: Emergency Medicine | Admitting: Emergency Medicine

## 2016-04-03 ENCOUNTER — Encounter: Payer: Self-pay | Admitting: Emergency Medicine

## 2016-04-03 ENCOUNTER — Emergency Department: Payer: BLUE CROSS/BLUE SHIELD

## 2016-04-03 DIAGNOSIS — S8991XA Unspecified injury of right lower leg, initial encounter: Secondary | ICD-10-CM | POA: Diagnosis present

## 2016-04-03 DIAGNOSIS — W010XXA Fall on same level from slipping, tripping and stumbling without subsequent striking against object, initial encounter: Secondary | ICD-10-CM | POA: Diagnosis not present

## 2016-04-03 DIAGNOSIS — M25561 Pain in right knee: Secondary | ICD-10-CM

## 2016-04-03 DIAGNOSIS — Y929 Unspecified place or not applicable: Secondary | ICD-10-CM | POA: Diagnosis not present

## 2016-04-03 DIAGNOSIS — Y939 Activity, unspecified: Secondary | ICD-10-CM | POA: Diagnosis not present

## 2016-04-03 DIAGNOSIS — S86901A Unspecified injury of unspecified muscle(s) and tendon(s) at lower leg level, right leg, initial encounter: Secondary | ICD-10-CM | POA: Diagnosis not present

## 2016-04-03 DIAGNOSIS — Y990 Civilian activity done for income or pay: Secondary | ICD-10-CM | POA: Diagnosis not present

## 2016-04-03 DIAGNOSIS — S86911A Strain of unspecified muscle(s) and tendon(s) at lower leg level, right leg, initial encounter: Secondary | ICD-10-CM

## 2016-04-03 DIAGNOSIS — F17210 Nicotine dependence, cigarettes, uncomplicated: Secondary | ICD-10-CM | POA: Diagnosis not present

## 2016-04-03 MED ORDER — NABUMETONE 750 MG PO TABS: 750.0000 mg | ORAL_TABLET | Freq: Two times a day (BID) | ORAL | 0 refills | Status: DC

## 2016-04-03 NOTE — ED Triage Notes (Signed)
Pt slipped on water at work today at [employer] .  Pt states his leg twisted inward when he fell down.  Now having 9/10 R knee pain.

## 2016-04-03 NOTE — ED Notes (Signed)
Pt states he slipped on water at work today and  R knee painful (9/10 on weight bearing).   Pt has had R knee arthroscopy in the past and wanted to make sure "everything is ok".

## 2016-04-03 NOTE — Discharge Instructions (Signed)
Wear the knee brace for comfort and support. Rest with the leg elevated and apply ice. Take the prescription anti-inflammatory for pain and swelling. Follow-up with Dr. Sabra Heck, or your company's provider for continued symptoms.

## 2016-04-03 NOTE — ED Provider Notes (Signed)
[facility] Emergency Department Provider Note ____________________________________________  Time seen: 1604  I have reviewed the triage vital signs and the nursing notes.  HISTORY  Chief Complaint  Knee Pain  HPI Edward Soto is a 40 y.o. male presents to the ED for evaluation of pain to his right knee status post a slip and fall. Patient describes slipping on a wet floor today at work earlier this morning. He describes landing primarily on the left side with the right leg and knee bent behind and underneath him. He describes feeling a pop to the medial and anterior aspects of the knee. He was able to continue work and dosed ibuprofen for pain relief. He presents now after experiencing increased pain and swelling walking. Denies any other injury at this time.This is a work related injury according to the patient.  Past Medical History:  Diagnosis Date  . Depression   . DVT (deep venous thrombosis) (HCC)    right leg  . H/O seasonal allergies     There are no active problems to display for this patient.   Past Surgical History:  Procedure Laterality Date  . FRACTURE SURGERY Right   . KNEE ARTHROSCOPY Right   . 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 Authorizing Provider  buPROPion (WELLBUTRIN XL) 150 MG 24 hr tablet Take 150 mg by mouth daily.    Historical Provider, MD  clonazePAM (KLONOPIN) 0.5 MG tablet Take 0.5 mg by mouth 2 (two) times daily as needed for anxiety.    Historical Provider, MD  FLUoxetine (PROZAC) 10 MG capsule Take 10 mg by mouth daily.    Historical Provider, MD  HYDROcodone-acetaminophen (NORCO/VICODIN) 5-325 MG per tablet Take 1 tablet by mouth every 4 (four) hours as needed for moderate pain.  03/04/15   Johnn Hai, PA-C  ibuprofen (ADVIL,MOTRIN) 800 MG tablet Take 1 tablet (800 mg total) by mouth 3 (three) times daily. 03/04/15   Johnn Hai, PA-C  loperamide (IMODIUM A-D) 2 MG tablet Take 1 tablet (2 mg total) by mouth 4 (four) times daily as needed for diarrhea or loose stools. 01/26/16   Anne-Caroline Mariea Clonts, MD  nabumetone (RELAFEN) 750 MG tablet Take 1 tablet (750 mg total) by mouth 2 (two) times daily. 04/03/16   Shanica Castellanos V Bacon Austen Oyster, PA-C  ondansetron (ZOFRAN) 4 MG tablet Take 1 tablet (4 mg total) by mouth every 8 (eight) hours as needed for nausea or vomiting. 01/26/16 01/25/17  Eula Listen, MD  zaleplon (SONATA) 10 MG capsule Take 10 mg by mouth at bedtime as needed for sleep.    Historical Provider, MD   Allergies Review of patient's allergies indicates no known allergies.  No family history on file.  Social History Social History  Substance Use Topics  . Smoking status: Current Some Day Smoker    Packs/day: 0.50    Years: 17.00    Types: Cigarettes  . Smokeless tobacco: Never Used  . Alcohol use No   Review of Systems  Constitutional: Negative for fever. Musculoskeletal: Negative for back pain.Right knee pain as above. Skin: Negative for rash. Neurological: Negative for headaches, focal weakness or numbness. ____________________________________________  PHYSICAL EXAM:  VITAL SIGNS: ED Triage Vitals  Enc Vitals Group     BP  04/03/16 1550 (!) 122/92     Pulse Rate 04/03/16 1550 88     Resp 04/03/16 1550 18     Temp 04/03/16 1550 98.6 F (37 C)     Temp Source 04/03/16 1550 Oral     SpO2 04/03/16 1550 98 %     Weight 04/03/16 1550 (!) 371 lb (168.3 kg)     Height 04/03/16 1550 6\' 2"  (1.88 m)     Head Circumference --      Peak Flow --      Pain Score 04/03/16 1558 9     Pain Loc --      Pain Edu? --      Excl. in [place]? --    Constitutional: Alert and oriented. Well appearing and in no distress. Head: Normocephalic and  atraumatic. Cardiovascular: Normal Distal pulses and capillary refill.  Respiratory: Normal respiratory effort.  Musculoskeletal: Right knee without obvious deformity, effusion, or edema. Patient has an obese body habitus and the right knee bony landmarks are difficult to distinct. He is able to express significant normal flexion and slightly decreased extension range of motion. No popliteal space tenderness is appreciated. No calf or Achilles tenderness noted. Patient with tenderness to palpation to the lateral aspect of the right knee. No valgus or varus joint stress laxity is noted. Negative drawer sign. Nontender with normal range of motion in all extremities.  Neurologic:  Normal speech and language. No gross focal neurologic deficits are appreciated. Skin:  Skin is warm, dry and intact. No rash noted. ____________________________________________   RADIOLOGY  Right Knee  IMPRESSION: No evidence for acute  abnormality.  I, Sanjuan Sawa, Dannielle Karvonen, personally viewed and evaluated these images (plain radiographs) as part of my medical decision making, as well as reviewing the written report by the radiologist. ____________________________________________  PROCEDURES  Ace bandage ____________________________________________  INITIAL IMPRESSION / ASSESSMENT AND PLAN / ED COURSE  Patient with an acute right knee sprain status post fall. He has no acute radiologic evidence of fracture or dislocation. He will be fitted with an Ace bandage and given a prescription for Relafen. He will follow with Dr. Earnestine Leys or his company required medical provider. He is also return to work with modified duty for the remainder of this week. No strenuous activity that he reports his supervisor can put him on primarily seated work.  Clinical Course   ____________________________________________  FINAL CLINICAL IMPRESSION(S) / ED DIAGNOSES  Final diagnoses:  Right knee pain  Knee strain, right,  initial encounter     Melvenia Needles, PA-C 04/03/16 1735    Orbie Pyo, MD 04/03/16 2337

## 2016-04-29 ENCOUNTER — Emergency Department
Admission: EM | Admit: 2016-04-29 | Discharge: 2016-04-29 | Disposition: A | Discharge status: Home / Self Care | Payer: BLUE CROSS/BLUE SHIELD | Attending: Emergency Medicine | Admitting: Emergency Medicine

## 2016-04-29 ENCOUNTER — Emergency Department: Payer: BLUE CROSS/BLUE SHIELD

## 2016-04-29 ENCOUNTER — Encounter: Payer: Self-pay | Admitting: Emergency Medicine

## 2016-04-29 DIAGNOSIS — W010XXA Fall on same level from slipping, tripping and stumbling without subsequent striking against object, initial encounter: Secondary | ICD-10-CM | POA: Diagnosis not present

## 2016-04-29 DIAGNOSIS — F17210 Nicotine dependence, cigarettes, uncomplicated: Secondary | ICD-10-CM | POA: Insufficient documentation

## 2016-04-29 DIAGNOSIS — Y9389 Activity, other specified: Secondary | ICD-10-CM | POA: Insufficient documentation

## 2016-04-29 DIAGNOSIS — Y92009 Unspecified place in unspecified non-institutional (private) residence as the place of occurrence of the external cause: Secondary | ICD-10-CM | POA: Insufficient documentation

## 2016-04-29 DIAGNOSIS — I87009 Postthrombotic syndrome without complications of unspecified extremity: Secondary | ICD-10-CM | POA: Diagnosis not present

## 2016-04-29 DIAGNOSIS — M25561 Pain in right knee: Secondary | ICD-10-CM | POA: Diagnosis present

## 2016-04-29 DIAGNOSIS — Y999 Unspecified external cause status: Secondary | ICD-10-CM | POA: Diagnosis not present

## 2016-04-29 DIAGNOSIS — Z79899 Other long term (current) drug therapy: Secondary | ICD-10-CM | POA: Insufficient documentation

## 2016-04-29 DIAGNOSIS — S8001XA Contusion of right knee, initial encounter: Secondary | ICD-10-CM

## 2016-04-29 MED ORDER — OXYCODONE-ACETAMINOPHEN 5-325 MG PO TABS
2.0000 | ORAL_TABLET | Freq: Once | ORAL | Status: AC
  Administered 2016-04-29: 2 via ORAL
  Filled 2016-04-29: qty 2

## 2016-04-29 MED ORDER — OXYCODONE-ACETAMINOPHEN 7.5-325 MG PO TABS: 1.0000 | ORAL_TABLET | ORAL | 0 refills | Status: DC | PRN

## 2016-04-29 NOTE — ED Triage Notes (Signed)
Slipped and fell yesterday at home. Pain R knee. History of injury previously to same knee.

## 2016-04-29 NOTE — ED Notes (Signed)
See triage note, pt re injured knee last night w/ fall.  Sensation and pulses intact, pt wearing brace from previous injury

## 2016-04-29 NOTE — ED Provider Notes (Signed)
[facility] Emergency Department Provider Note   ____________________________________________   None    (approximate)  I have reviewed the triage vital signs and the nursing notes.   HISTORY  Chief Complaint Knee Pain    HPI Edward Soto is a 40 y.o. male patient complaining of right knee pain secondary to a slip and fall. Patient stated this incident occurred at home. Patient has a history of previous injury to the same knee. Patient is rating his pain as a 9/10. Patient described a pain as sharp. Patient is wearing a knee brace to use from a previous injury.Patient was seen 6 weeks ago. X-ray of the knee was unremarkable except for Baker's cyst and patient was negative for DVT per ultrasound. Pain to the right knee was diagnosis possible meniscus injury  he was advised to follow orthopedics. Patient state orthopedics Place him in a knee brace and he was doing well until he slipped and fell last night. Patient is rating his pain as a 9/10. Patient state he feels the same as previous injury 6 weeks ago. No palliative measures except for when the knee brace prior to arrival.   Past Medical History:  Diagnosis Date  . Depression   . DVT (deep venous thrombosis) (HCC)    right leg  . H/O seasonal allergies     There are no active problems to display for this patient.   Past Surgical History:  Procedure Laterality Date  . FRACTURE SURGERY Right   . KNEE ARTHROSCOPY Right   . 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 Authorizing Provider  buPROPion (WELLBUTRIN XL) 150 MG 24 hr tablet Take 150 mg by mouth daily.    Historical Provider, MD  clonazePAM (KLONOPIN) 0.5 MG tablet Take 0.5 mg by mouth 2 (two) times daily as  needed for anxiety.    Historical Provider, MD  FLUoxetine (PROZAC) 10 MG capsule Take 10 mg by mouth daily.    Historical Provider, MD  HYDROcodone-acetaminophen (NORCO/VICODIN) 5-325 MG per tablet Take 1 tablet by mouth every 4 (four) hours as needed for moderate pain. 03/04/15   Johnn Hai, PA-C  ibuprofen (ADVIL,MOTRIN) 800 MG tablet Take 1 tablet (800 mg total) by mouth 3 (three) times daily. 03/04/15   Johnn Hai, PA-C  loperamide (IMODIUM A-D) 2 MG tablet Take 1 tablet (2 mg total) by mouth 4 (four) times daily as needed for diarrhea or loose stools. 01/26/16   Anne-Caroline Mariea Clonts, MD  nabumetone (RELAFEN) 750 MG tablet Take 1 tablet (750 mg total) by mouth 2 (two) times daily. 04/03/16   Jenise V Bacon Menshew, PA-C  ondansetron (ZOFRAN) 4 MG tablet Take 1 tablet (4 mg total) by mouth every 8 (eight) hours as needed for nausea or vomiting. 01/26/16 01/25/17  Eula Listen, MD  oxyCODONE-acetaminophen (PERCOCET) 7.5-325 MG tablet Take 1 tablet by mouth every 4 (four) hours as needed for severe pain. 04/29/16   Sable Feil, PA-C  zaleplon (SONATA) 10 MG capsule Take 10 mg by mouth at bedtime as needed for sleep.    Historical Provider, MD    Allergies Review of patient's allergies indicates no known allergies.  No family history on file.  Social History Social History  Substance Use Topics  .  Smoking status: Current Some Day Smoker    Packs/day: 0.50    Years: 17.00    Types: Cigarettes  . Smokeless tobacco: Never Used  . Alcohol use No    Review of Systems Constitutional: No fever/chills. Overweight Eyes: No visual changes. ENT: No sore throat. Cardiovascular: Denies chest pain. Respiratory: Denies shortness of breath. Gastrointestinal: No abdominal pain.  No nausea, no vomiting.  No diarrhea.  No constipation. Genitourinary: Negative for dysuria. Musculoskeletal: Right knee pain. Skin: Negative for rash. Neurological: Negative for headaches, focal weakness or  numbness.   ____________________________________________   PHYSICAL EXAM:  VITAL SIGNS: ED Triage Vitals  Enc Vitals Group     BP 04/29/16 1138 130/75     Pulse Rate 04/29/16 1138 87     Resp 04/29/16 1138 18     Temp 04/29/16 1138 98.5 F (36.9 C)     Temp Source 04/29/16 1138 Oral     SpO2 04/29/16 1138 94 %     Weight 04/29/16 1140 (!) 350 lb (158.8 kg)     Height 04/29/16 1140 6\' 2"  (1.88 m)     Head Circumference --      Peak Flow --      Pain Score 04/29/16 1140 9     Pain Loc --      Pain Edu? --      Excl. in [place]? --     Constitutional: Alert and oriented. Well appearing and in no acute distress. Eyes: Conjunctivae are normal. PERRL. EOMI. Head: Atraumatic. Nose: No congestion/rhinnorhea. Mouth/Throat: Mucous membranes are moist.  Oropharynx non-erythematous. Neck: No stridor.  No cervical spine tenderness to palpation. Hematological/Lymphatic/Immunilogical: No cervical lymphadenopathy. Cardiovascular: Normal rate, regular rhythm. Grossly normal heart sounds.  Good peripheral circulation. Respiratory: Normal respiratory effort.  No retractions. Lungs CTAB. Gastrointestinal: Soft and nontender. No distention. No abdominal bruits. No CVA tenderness. Musculoskeletal: No obvious deformity to the right knee. Patient has some mild edema with moderate guarding palpation anterior patella. Patient has some moderate crepitus with palpation. No laxity with vagus  stress testing. Patient has a negative anterior drawer test. Neurologic:  Normal speech and language. No gross focal neurologic deficits are appreciated. No gait instability. Skin:  Skin is warm, dry and intact. No rash noted. Psychiatric: Mood and affect are normal. Speech and behavior are normal.  ____________________________________________   LABS (all labs ordered are listed, but only abnormal results are displayed)  Labs Reviewed - No data to  display ____________________________________________  EKG   ____________________________________________  RADIOLOGY  Mild degenerative changes with soft tissue swelling on the medial aspect of the patella. ____________________________________________   PROCEDURES  Procedure(s) performed: None  Procedures  Critical Care performed: No  ____________________________________________   INITIAL IMPRESSION / ASSESSMENT AND PLAN / ED COURSE  Pertinent labs & imaging results that were available during my care of the patient were reviewed by me and considered in my medical decision making (see chart for details).  Knee pain secondary to fall. Discussed x-ray finding with patient. Patient given discharge care instructions. Patient given a work note. Patient advised follow-up with her treating doctor for continued care.  Clinical Course     ____________________________________________   FINAL CLINICAL IMPRESSION(S) / ED DIAGNOSES  Final diagnoses:  Knee contusion, right, initial encounter      NEW MEDICATIONS STARTED DURING THIS VISIT:  New Prescriptions   OXYCODONE-ACETAMINOPHEN (PERCOCET) 7.5-325 MG TABLET    Take 1 tablet by mouth every 4 (four) hours as needed for severe pain.  Note:  This document was prepared using Dragon voice recognition software and may include unintentional dictation errors.    Sable Feil, PA-C 04/29/16 [place], MD 04/29/16 [phone]

## 2016-08-24 ENCOUNTER — Encounter: Payer: Self-pay | Admitting: Emergency Medicine

## 2016-08-24 ENCOUNTER — Emergency Department
Admission: EM | Admit: 2016-08-24 | Discharge: 2016-08-24 | Disposition: A | Discharge status: Home / Self Care | Payer: BLUE CROSS/BLUE SHIELD | Attending: Emergency Medicine | Admitting: Emergency Medicine

## 2016-08-24 ENCOUNTER — Emergency Department: Payer: BLUE CROSS/BLUE SHIELD

## 2016-08-24 DIAGNOSIS — Z79899 Other long term (current) drug therapy: Secondary | ICD-10-CM | POA: Diagnosis not present

## 2016-08-24 DIAGNOSIS — J069 Acute upper respiratory infection, unspecified: Secondary | ICD-10-CM | POA: Insufficient documentation

## 2016-08-24 DIAGNOSIS — H6691 Otitis media, unspecified, right ear: Secondary | ICD-10-CM | POA: Diagnosis not present

## 2016-08-24 DIAGNOSIS — F17210 Nicotine dependence, cigarettes, uncomplicated: Secondary | ICD-10-CM | POA: Insufficient documentation

## 2016-08-24 DIAGNOSIS — R05 Cough: Secondary | ICD-10-CM | POA: Diagnosis present

## 2016-08-24 MED ORDER — AMOXICILLIN 500 MG PO TABS: 500.0000 mg | ORAL_TABLET | Freq: Three times a day (TID) | ORAL | 0 refills | Status: DC

## 2016-08-24 MED ORDER — GUAIFENESIN-CODEINE 100-10 MG/5ML PO SYRP: 5.0000 mL | ORAL_SOLUTION | Freq: Three times a day (TID) | ORAL | 0 refills | Status: DC | PRN

## 2016-08-24 NOTE — Discharge Instructions (Signed)
Take the amoxicillin until finished. Use the cough medication as prescribed as needed for cough. Follow up with your PCP in about a week. Return to the ER for symptoms that change or worsen if unable to schedule an appointment.

## 2016-08-24 NOTE — ED Provider Notes (Signed)
[facility] Emergency Department Provider Note  ____________________________________________  Time seen: Approximately 6:51 PM  I have reviewed the triage vital signs and the nursing notes.   HISTORY  Chief Complaint Cough and Nasal Congestion   HPI Edward Soto is a 40 y.o. male who presents to the emergency department for evaluation of cough, fever, right earache, and sore throat. Cough and sore throat started a few days ago. Fever and right earache started 2 days ago. He states that his cough has been productive of green sputum. He had anterior cruciate ligament repair earlier in December. He denies shortness of breath or dyspnea on exertion. He has had a DVT in the right lower extremity since the anterior cruciate ligament repair and is currently on aspirin. Past Medical History:  Diagnosis Date  . Depression   . DVT (deep venous thrombosis) (HCC)    right leg  . H/O seasonal allergies     There are no active problems to display for this patient.   Past Surgical History:  Procedure Laterality Date  . FRACTURE SURGERY Right   . KNEE ARTHROSCOPY Right   . 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 Authorizing Provider  amoxicillin (AMOXIL) 500 MG tablet Take 1 tablet (500 mg total) by mouth 3 (three) times daily. 08/24/16   Victorino Dike, FNP  buPROPion (WELLBUTRIN XL) 150 MG 24 hr tablet Take 150 mg by mouth daily.    Historical Provider, MD  clonazePAM (KLONOPIN) 0.5 MG tablet Take 0.5 mg by mouth 2 (two) times daily as needed for anxiety.    Historical Provider, MD  FLUoxetine (PROZAC) 10 MG capsule Take 10 mg by mouth daily.    Historical Provider, MD  guaiFENesin-codeine (ROBITUSSIN AC) 100-10 MG/5ML syrup Take 5  mLs by mouth 3 (three) times daily as needed for cough. 08/24/16   Victorino Dike, FNP  HYDROcodone-acetaminophen (NORCO/VICODIN) 5-325 MG per tablet Take 1 tablet by mouth every 4 (four) hours as needed for moderate pain. 03/04/15   Johnn Hai, PA-C  ibuprofen (ADVIL,MOTRIN) 800 MG tablet Take 1 tablet (800 mg total) by mouth 3 (three) times daily. 03/04/15   Johnn Hai, PA-C  loperamide (IMODIUM A-D) 2 MG tablet Take 1 tablet (2 mg total) by mouth 4 (four) times daily as needed for diarrhea or loose stools. 01/26/16   Anne-Caroline Mariea Clonts, MD  nabumetone (RELAFEN) 750 MG tablet Take 1 tablet (750 mg total) by mouth 2 (two) times daily. 04/03/16   Jenise V Bacon Menshew, PA-C  ondansetron (ZOFRAN) 4 MG tablet Take 1 tablet (4 mg total) by mouth every 8 (eight) hours as needed for nausea or vomiting. 01/26/16 01/25/17  Eula Listen, MD  oxyCODONE-acetaminophen (PERCOCET) 7.5-325 MG tablet Take 1 tablet by mouth every 4 (four) hours as needed for severe pain. 04/29/16   Sable Feil, PA-C  zaleplon (SONATA) 10 MG capsule Take 10 mg by mouth at bedtime as needed for sleep.    Historical Provider, MD    Allergies Patient has no known allergies.  History reviewed. No pertinent family history.  Social History Social History  Substance Use Topics  . Smoking status: Current Some Day Smoker    Packs/day: 0.50    Years: 17.00  Types: Cigarettes  . Smokeless tobacco: Never Used  . Alcohol use No    Review of Systems Constitutional: Positive for fever/chills ENT: Positive for sore throat. Cardiovascular: Denies chest pain. Respiratory: Negative for shortness of breath. Positive for cough. Gastrointestinal: Negative for nausea,  no vomiting.  Negative for diarrhea.  Musculoskeletal: Negative for body aches Skin: Negative for rash. Neurological: Negative for headaches ____________________________________________   PHYSICAL EXAM:  VITAL SIGNS: ED Triage Vitals [08/24/16 1820]   Enc Vitals Group     BP 140/86     Pulse Rate (!) 102     Resp 20     Temp 97.5 F (36.4 C)     Temp Source Oral     SpO2 97 %     Weight (!) 350 lb (158.8 kg)     Height 6\' 2"  (1.88 m)     Head Circumference      Peak Flow      Pain Score 5     Pain Loc      Pain Edu?      Excl. in [place]?     Constitutional: Alert and oriented. Well appearing and in no acute distress. Eyes: Conjunctivae are normal. EOMI. Ears: Left tympanic membrane normal right tympanic membrane dull and erythematous. Nose: Maxillary sinus congestion without tenderness; no rhinnorhea. Mouth/Throat: Mucous membranes are moist.  Oropharynx mildly erythematous. Tonsils appear 1+ without exudate. Neck: No stridor.  Lymphatic: Bilateral anterior cervical lymphadenopathy. Cardiovascular: Normal rate, regular rhythm. Grossly normal heart sounds.  Good peripheral circulation. Respiratory: Normal respiratory effort.  No retractions. Rhonchi noted in the right lower lung fields, otherwise clear throughout. Gastrointestinal: Soft and nontender.  Musculoskeletal: FROM x 4 extremities.  Neurologic:  Normal speech and language.  Skin:  Skin is warm, dry and intact. No rash noted. Psychiatric: Mood and affect are normal. Speech and behavior are normal.  ____________________________________________   LABS (all labs ordered are listed, but only abnormal results are displayed)  Labs Reviewed - No data to display ____________________________________________  EKG  Not indicated ____________________________________________  RADIOLOGY    Peribronchial thickening noted on the chest x-ray, otherwise no acute cardiopulmonary abnormality per radiology. ____________________________________________   PROCEDURES  Procedure(s) performed: None  Critical Care performed: No  ____________________________________________   INITIAL IMPRESSION / ASSESSMENT AND PLAN / ED COURSE  Clinical Course     Pertinent labs &  imaging results that were available during my care of the patient were reviewed by me and considered in my medical decision making (see chart for details).   40 year old male presenting to the emergency department for evaluation of fever, cough, sore throat, and right ear pain. Chest x-ray negative for acute cardiopulmonary abnormality per radiology and therefore he will be treated with amoxicillin for the right otitis media and given Robitussin-AC for cough. He was instructed to follow-up with his primary care provider in one week for recheck. He was instructed to return to the emergency department for symptoms that change or worsen if he is unable to schedule an appointment. ____________________________________________   FINAL CLINICAL IMPRESSION(S) / ED DIAGNOSES  Final diagnoses:  Acute upper respiratory infection  Right otitis media, unspecified otitis media type    Note:  This document was prepared using Dragon voice recognition software and may include unintentional dictation errors.     Victorino Dike, FNP 08/24/16 2014    Earleen Newport, MD 08/24/16 [phone]

## 2016-08-24 NOTE — ED Triage Notes (Signed)
Pt to ed with c/o cough and congestion, denies fever x 2 days. Appears in no acute resp distress at this time.

## 2017-05-26 ENCOUNTER — Emergency Department: Payer: BLUE CROSS/BLUE SHIELD

## 2017-05-26 ENCOUNTER — Encounter: Payer: Self-pay | Admitting: Emergency Medicine

## 2017-05-26 ENCOUNTER — Emergency Department
Admission: EM | Admit: 2017-05-26 | Discharge: 2017-05-26 | Disposition: A | Discharge status: Home / Self Care | Payer: BLUE CROSS/BLUE SHIELD | Attending: Emergency Medicine | Admitting: Emergency Medicine

## 2017-05-26 DIAGNOSIS — R1031 Right lower quadrant pain: Secondary | ICD-10-CM | POA: Insufficient documentation

## 2017-05-26 DIAGNOSIS — R109 Unspecified abdominal pain: Secondary | ICD-10-CM

## 2017-05-26 DIAGNOSIS — Z79899 Other long term (current) drug therapy: Secondary | ICD-10-CM | POA: Diagnosis not present

## 2017-05-26 DIAGNOSIS — F17210 Nicotine dependence, cigarettes, uncomplicated: Secondary | ICD-10-CM | POA: Insufficient documentation

## 2017-05-26 LAB — CBC
HCT: 45.9 % (ref 40.0–52.0)
HEMOGLOBIN: 15.6 g/dL (ref 13.0–18.0)
MCH: 32.7 pg (ref 26.0–34.0)
MCHC: 34 g/dL (ref 32.0–36.0)
MCV: 96 fL (ref 80.0–100.0)
Platelets: 159 10*3/uL (ref 150–440)
RBC: 4.78 MIL/uL (ref 4.40–5.90)
RDW: 13.1 % (ref 11.5–14.5)
WBC: 7.8 10*3/uL (ref 3.8–10.6)

## 2017-05-26 LAB — URINALYSIS, COMPLETE (UACMP) WITH MICROSCOPIC
Bacteria, UA: NONE SEEN
Bilirubin Urine: NEGATIVE
GLUCOSE, UA: NEGATIVE mg/dL
HGB URINE DIPSTICK: NEGATIVE
Ketones, ur: NEGATIVE mg/dL
Leukocytes, UA: NEGATIVE
NITRITE: NEGATIVE
PROTEIN: NEGATIVE mg/dL
RBC / HPF: NONE SEEN RBC/hpf (ref 0–5)
Specific Gravity, Urine: 1.014 (ref 1.005–1.030)
pH: 5 (ref 5.0–8.0)

## 2017-05-26 LAB — COMPREHENSIVE METABOLIC PANEL
ALK PHOS: 70 U/L (ref 38–126)
ALT: 41 U/L (ref 17–63)
AST: 26 U/L (ref 15–41)
Albumin: 4.6 g/dL (ref 3.5–5.0)
Anion gap: 10 (ref 5–15)
BILIRUBIN TOTAL: 0.7 mg/dL (ref 0.3–1.2)
BUN: 15 mg/dL (ref 6–20)
CALCIUM: 9.5 mg/dL (ref 8.9–10.3)
CO2: 30 mmol/L (ref 22–32)
CREATININE: 1.33 mg/dL — AB (ref 0.61–1.24)
Chloride: 101 mmol/L (ref 101–111)
Glucose, Bld: 126 mg/dL — ABNORMAL HIGH (ref 65–99)
Potassium: 4.2 mmol/L (ref 3.5–5.1)
Sodium: 141 mmol/L (ref 135–145)
TOTAL PROTEIN: 7.7 g/dL (ref 6.5–8.1)

## 2017-05-26 LAB — LIPASE, BLOOD: Lipase: 33 U/L (ref 11–51)

## 2017-05-26 MED ORDER — IOPAMIDOL (ISOVUE-300) INJECTION 61%
30.0000 mL | Freq: Once | INTRAVENOUS | Status: AC | PRN
  Administered 2017-05-26: 30 mL via ORAL

## 2017-05-26 MED ORDER — IOPAMIDOL (ISOVUE-370) INJECTION 76%
100.0000 mL | Freq: Once | INTRAVENOUS | Status: AC | PRN
  Administered 2017-05-26: 100 mL via INTRAVENOUS

## 2017-05-26 MED ORDER — KETOROLAC TROMETHAMINE 30 MG/ML IJ SOLN
30.0000 mg | Freq: Once | INTRAMUSCULAR | Status: AC
  Administered 2017-05-26: 30 mg via INTRAVENOUS
  Filled 2017-05-26: qty 1

## 2017-05-26 NOTE — ED Notes (Signed)
Pt ambulated to restroom without difficulty

## 2017-05-26 NOTE — Discharge Instructions (Signed)
The lab work and CAT scan were essentially normal. there is nothing really to explain the pain which are having. I will try some Toradol and give you prescription for pills 14 times a day for a few days to see if that helps. Please follow-up with your regular doctor. Please make sure you take the Toradol with food. Please return for increasing pain fever or vomiting.

## 2017-05-26 NOTE — ED Notes (Signed)
Pt verbalizes d/c teaching and Rx. Pt ambulatory, NAD noted, VS stable at time of d.c

## 2017-05-26 NOTE — ED Notes (Signed)
Ben in [place] informed that pt finished his oral contrast

## 2017-05-26 NOTE — ED Provider Notes (Signed)
[facility] Emergency Department Provider Note   ____________________________________________   First MD Initiated Contact with Patient 05/26/17 1636     (approximate)  I have reviewed the triage vital signs and the nursing notes.   HISTORY  Chief Complaint Abdominal Pain  HPI Edward Soto is a 41 y.o. male Who reports he has pain in the right side of his abdomen. Runs from just under the ribs down into the groin. Pain is made worse by moving and coughing sometimes by eating spicy or fatty foods. Mostly made worse by palpation. Been going on for about 2 weeks really never went away as moderate in severity has no other associated symptoms with that he is not running a fever is not short of breath has no nausea vomiting or diarrhea   Past Medical History:  Diagnosis Date  . Depression   . DVT (deep venous thrombosis) (HCC)    right leg  . H/O seasonal allergies     There are no active problems to display for this patient.   Past Surgical History:  Procedure Laterality Date  . FRACTURE SURGERY Right   . KNEE ARTHROSCOPY Right   . 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 Authorizing Provider  amoxicillin (AMOXIL) 500 MG tablet Take 1 tablet (500 mg total) by mouth 3 (three) times daily. 08/24/16   Triplett, Cari B, FNP  buPROPion (WELLBUTRIN XL) 150 MG 24 hr tablet Take 150 mg by mouth daily.    [provider]  clonazePAM (KLONOPIN) 0.5 MG tablet Take 0.5 mg by mouth 2 (two) times daily as needed for anxiety.    [provider]  FLUoxetine (PROZAC) 10 MG capsule Take 10 mg by mouth daily.    [provider]  guaiFENesin-codeine (ROBITUSSIN AC) 100-10 MG/5ML syrup Take 5 mLs by mouth 3  (three) times daily as needed for cough. 08/24/16   Triplett, Johnette Abraham B, FNP  HYDROcodone-acetaminophen (NORCO/VICODIN) 5-325 MG per tablet Take 1 tablet by mouth every 4 (four) hours as needed for moderate pain. 03/04/15   Johnn Hai, PA-C  ibuprofen (ADVIL,MOTRIN) 800 MG tablet Take 1 tablet (800 mg total) by mouth 3 (three) times daily. 03/04/15   Johnn Hai, PA-C  loperamide (IMODIUM A-D) 2 MG tablet Take 1 tablet (2 mg total) by mouth 4 (four) times daily as needed for diarrhea or loose stools. 01/26/16   Eula Listen, MD  nabumetone (RELAFEN) 750 MG tablet Take 1 tablet (750 mg total) by mouth 2 (two) times daily. 04/03/16   Menshew, Dannielle Karvonen, PA-C  oxyCODONE-acetaminophen (PERCOCET) 7.5-325 MG tablet Take 1 tablet by mouth every 4 (four) hours as needed for severe pain. 04/29/16   Sable Feil, PA-C  zaleplon (SONATA) 10 MG capsule Take 10 mg by mouth at bedtime as needed for sleep.    [provider]    Allergies Patient has no known allergies.  No family history on file.  Social History Social History  Substance Use Topics  . Smoking status: Current Some Day Smoker    Packs/day: 0.50    Years: 17.00    Types: Cigarettes  . Smokeless tobacco: Never Used  . Alcohol use No    Review of Systems  Constitutional: No  fever/chills Eyes: No visual changes. ENT: No sore throat. Cardiovascular: Denies chest pain. Respiratory: Denies shortness of breath. Gastrointestinal:see history of present illness Genitourinary: Negative for dysuria. Musculoskeletal: Negative for back pain. Skin: Negative for rash. Neurological: Negative for headaches, focal weakness   ____________________________________________   PHYSICAL EXAM:  VITAL SIGNS: ED Triage Vitals  Enc Vitals Group     BP 05/26/17 1530 (!) 144/86     Pulse Rate 05/26/17 1530 94     Resp 05/26/17 1530 (!) 22     Temp 05/26/17 1530 98.3 F (36.8 C)     Temp Source 05/26/17 1530 Oral     SpO2  05/26/17 1530 92 %     Weight 05/26/17 1530 (!) 375 lb (170.1 kg)     Height 05/26/17 1530 6\' 2"  (1.88 m)     Head Circumference --      Peak Flow --      Pain Score 05/26/17 1529 5     Pain Loc --      Pain Edu? --      Excl. in [place]? --     Constitutional: Alert and oriented. Well appearing and in no acute distress. Eyes: Conjunctivae are normal.  Head: Atraumatic. Nose: No congestion/rhinnorhea. Mouth/Throat: Mucous membranes are moist.  Oropharynx non-erythematous. Neck: No stridor.   Cardiovascular: Normal rate, regular rhythm. Grossly normal heart sounds.  Good peripheral circulation. Respiratory: Normal respiratory effort.  No retractions. Lungs CTAB. Gastrointestinal: Soft tender to palpation on the right side from the right upper quadrant to the right lower quadrant no tenderness on the left or in the middle of the abdomen No distention. No abdominal bruits. No CVA tenderness. Musculoskeletal: No lower extremity tenderness nor edema.  No joint effusions. Neurologic:  Normal speech and language. No gross focal neurologic deficits are appreciated. No gait instability. Skin:  Skin is warm, dry and intact. No rash noted. Psychiatric: Mood and affect are normal. Speech and behavior are normal.  ____________________________________________   LABS (all labs ordered are listed, but only abnormal results are displayed)  Labs Reviewed  COMPREHENSIVE METABOLIC PANEL - Abnormal; Notable for the following:       Result Value   Glucose, Bld 126 (*)    Creatinine, Ser 1.33 (*)    All other components within normal limits  URINALYSIS, COMPLETE (UACMP) WITH MICROSCOPIC - Abnormal; Notable for the following:    Color, Urine YELLOW (*)    APPearance CLEAR (*)    Squamous Epithelial / LPF 0-5 (*)    All other components within normal limits  LIPASE, BLOOD  CBC   ____________________________________________  EKG   ____________________________________________  RADIOLOGY  CT of  the belly shows no acute problems or some fatty infiltration of liver but nothing would really explain his pain. ____________________________________________   PROCEDURES  Procedure(s) performed:   Procedures  Critical Care performed:  ____________________________________________   INITIAL IMPRESSION / ASSESSMENT AND PLAN / ED COURSE  Pertinent labs & imaging results that were available during my care of the patient were reviewed by me and considered in my medical decision making (see chart for details).  it's possible the patient's pain may have come from straining his abdominal muscles as he thinks I will give him some Toradol to see if this helps.  Follow-up with his regular physician and return for any further problems.      ____________________________________________   FINAL CLINICAL IMPRESSION(S) / ED DIAGNOSES  Final diagnoses:  Abdominal pain, unspecified abdominal location  NEW MEDICATIONS STARTED DURING THIS VISIT:  New Prescriptions   No medications on file     Note:  This document was prepared using Dragon voice recognition software and may include unintentional dictation errors.    Nena Polio, MD 05/26/17 2012

## 2017-05-26 NOTE — ED Triage Notes (Signed)
Pt c/o RLQ abd pain x 2wks, denies fever or n/v/d. Pt ambulatory, NAD noted. VS stable.

## 2018-02-28 ENCOUNTER — Other Ambulatory Visit: Payer: Self-pay

## 2018-02-28 ENCOUNTER — Emergency Department: Payer: BLUE CROSS/BLUE SHIELD

## 2018-02-28 ENCOUNTER — Emergency Department
Admission: EM | Admit: 2018-02-28 | Discharge: 2018-02-28 | Disposition: A | Discharge status: Home / Self Care | Payer: BLUE CROSS/BLUE SHIELD | Attending: Student in an Organized Health Care Education/Training Program | Admitting: Student in an Organized Health Care Education/Training Program

## 2018-02-28 ENCOUNTER — Encounter: Payer: Self-pay | Admitting: Emergency Medicine

## 2018-02-28 DIAGNOSIS — Z79899 Other long term (current) drug therapy: Secondary | ICD-10-CM | POA: Diagnosis not present

## 2018-02-28 DIAGNOSIS — F17210 Nicotine dependence, cigarettes, uncomplicated: Secondary | ICD-10-CM | POA: Insufficient documentation

## 2018-02-28 DIAGNOSIS — M25511 Pain in right shoulder: Secondary | ICD-10-CM | POA: Insufficient documentation

## 2018-02-28 MED ORDER — MELOXICAM 15 MG PO TABS: 15.0000 mg | ORAL_TABLET | Freq: Every day | ORAL | 1 refills | Status: AC

## 2018-02-28 NOTE — ED Notes (Signed)
See triage note  Presents with right shoulder pain after falling out of bed about 2 1/2 weeks ago  States today he was playing around and now having increased pain to shoulder   No deformity noted   Good pulses

## 2018-02-28 NOTE — ED Provider Notes (Signed)
[facility] Emergency Department Provider Note  ____________________________________________  Time seen: Approximately 6:51 PM  I have reviewed the triage vital signs and the nursing notes.   HISTORY  Chief Complaint Shoulder Pain    HPI SHEM Edward Soto is a 42 y.o. male presents to the emergency department with persistent right shoulder pain that is 6 out of 10 in intensity.  Patient first developed the pain after he rolled out of bed and fell onto his shoulder.  He experiences pain with flexion at the shoulder greater than 30 degrees.  Patient reports some weakness but no radiculopathy or neck pain.  No skin compromise.  Patient has never had a right shoulder surgery in the past or chronic right shoulder issues.   Past Medical History:  Diagnosis Date  . Depression   . DVT (deep venous thrombosis) (HCC)    right leg  . H/O seasonal allergies     There are no active problems to display for this patient.   Past Surgical History:  Procedure Laterality Date  . FRACTURE SURGERY Right   . KNEE ARTHROSCOPY Right   . 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 Authorizing Provider  amoxicillin (AMOXIL) 500 MG tablet Take 1 tablet (500 mg total) by mouth 3 (three) times daily. 08/24/16   Triplett, Cari B, FNP  buPROPion (WELLBUTRIN XL) 150 MG 24 hr tablet Take 150 mg by mouth daily.    [provider]  clonazePAM (KLONOPIN) 0.5 MG tablet Take 0.5 mg by mouth 2 (two) times daily as needed for anxiety.    [provider]  FLUoxetine (PROZAC) 10 MG capsule Take 10 mg by mouth daily.    [provider]  guaiFENesin-codeine (ROBITUSSIN AC) 100-10 MG/5ML syrup Take 5 mLs by mouth 3 (three) times daily as  needed for cough. 08/24/16   Triplett, Johnette Abraham B, FNP  HYDROcodone-acetaminophen (NORCO/VICODIN) 5-325 MG per tablet Take 1 tablet by mouth every 4 (four) hours as needed for moderate pain. 03/04/15   Johnn Hai, PA-C  ibuprofen (ADVIL,MOTRIN) 800 MG tablet Take 1 tablet (800 mg total) by mouth 3 (three) times daily. 03/04/15   Johnn Hai, PA-C  loperamide (IMODIUM A-D) 2 MG tablet Take 1 tablet (2 mg total) by mouth 4 (four) times daily as needed for diarrhea or loose stools. 01/26/16   Eula Listen, MD  meloxicam (MOBIC) 15 MG tablet Take 1 tablet (15 mg total) by mouth daily for 7 days. 02/28/18 03/07/18  Lannie Fields, PA-C  nabumetone (RELAFEN) 750 MG tablet Take 1 tablet (750 mg total) by mouth 2 (two) times daily. 04/03/16   Menshew, Dannielle Karvonen, PA-C  oxyCODONE-acetaminophen (PERCOCET) 7.5-325 MG tablet Take 1 tablet by mouth every 4 (four) hours as needed for severe pain. 04/29/16   Sable Feil, PA-C  zaleplon (SONATA) 10 MG capsule Take 10 mg by mouth at bedtime as needed for sleep.    [provider]    Allergies Patient has no known allergies.  No family history on file.  Social History Social History   Tobacco Use  . Smoking status: Current Some Day Smoker    Packs/day: 0.50    Years: 17.00    Pack years: 8.50    Types:  Cigarettes  . Smokeless tobacco: Never Used  Substance Use Topics  . Alcohol use: No  . Drug use: No     Review of Systems  Constitutional: No fever/chills Eyes: No visual changes. No discharge ENT: No upper respiratory complaints. Cardiovascular: no chest pain. Respiratory: no cough. No SOB. Gastrointestinal: No abdominal pain.  No nausea, no vomiting.  No diarrhea.  No constipation. Musculoskeletal: Patient has right shoulder pain.  Skin: Negative for rash, abrasions, lacerations, ecchymosis. Neurological: Negative for headaches, focal weakness or numbness. .  ____________________________________________   PHYSICAL  EXAM:  VITAL SIGNS: ED Triage Vitals  Enc Vitals Group     BP 02/28/18 1714 137/90     Pulse Rate 02/28/18 1714 70     Resp 02/28/18 1714 19     Temp 02/28/18 1714 98.1 F (36.7 C)     Temp Source 02/28/18 1714 Oral     SpO2 02/28/18 1714 97 %     Weight 02/28/18 1715 (!) 400 lb (181.4 kg)     Height 02/28/18 1715 6\' 2"  (1.88 m)     Head Circumference --      Peak Flow --      Pain Score 02/28/18 1714 4     Pain Loc --      Pain Edu? --      Excl. in [place]? --      Constitutional: Alert and oriented. Well appearing and in no acute distress. Eyes: Conjunctivae are normal. PERRL. EOMI. Head: Atraumatic. Cardiovascular: Normal rate, regular rhythm. Normal S1 and S2.  Good peripheral circulation. Respiratory: Normal respiratory effort without tachypnea or retractions. Lungs CTAB. Good air entry to the bases with no decreased or absent breath sounds. Musculoskeletal: Patient can demonstrate flexion at the right shoulder to 90 degrees.  He has right rotator cuff pain with testing but no weakness.  No palpable step-off deformity of the AC joint.  Palpable radial pulse, right. Neurologic:  Normal speech and language. No gross focal neurologic deficits are appreciated.  Skin:  Skin is warm, dry and intact. No rash noted. ______________________________________   LABS (all labs ordered are listed, but only abnormal results are displayed)  Labs Reviewed - No data to display ____________________________________________  EKG   ____________________________________________  RADIOLOGY I personally viewed and evaluated these images as part of my medical decision making, as well as reviewing the written report by the radiologist.  Dg Shoulder Right  Result Date: 02/28/2018 CLINICAL DATA:  Right shoulder pain.  No known injury. EXAM: RIGHT SHOULDER - 2+ VIEW COMPARISON:  None. FINDINGS: There is no evidence of fracture or dislocation. There is no evidence of arthropathy or other focal bone  abnormality. Soft tissues are unremarkable. IMPRESSION: Negative. Electronically Signed   By: Rolm Baptise M.D.   On: 02/28/2018 17:53    ____________________________________________    PROCEDURES  Procedure(s) performed:    Procedures    Medications - No data to display   ____________________________________________   INITIAL IMPRESSION / ASSESSMENT AND PLAN / ED COURSE  Pertinent labs & imaging results that were available during my care of the patient were reviewed by me and considered in my medical decision making (see chart for details).  Review of the [place] CSRS was performed in accordance of the [place] prior to dispensing any controlled drugs.      Assessment and plan Right shoulder pain Patient presents to the emergency department with right shoulder pain after a fall that occurred 3 weeks ago.  Differential diagnosis included fracture  versus rotator cuff tear/strain.  X-ray examination of the right shoulder revealed no fractures or acute bony abnormalities.  Physical exam revealed pain with right rotator cuff testing but no weakness.  Patient was discharged with meloxicam and advised to follow-up with orthopedics.  All patient questions were answered.   ____________________________________________  FINAL CLINICAL IMPRESSION(S) / ED DIAGNOSES  Final diagnoses:  Acute pain of right shoulder      NEW MEDICATIONS STARTED DURING THIS VISIT:  ED Discharge Orders        Ordered    meloxicam (MOBIC) 15 MG tablet  Daily     02/28/18 1829          This chart was dictated using voice recognition software/Dragon. Despite best efforts to proofread, errors can occur which can change the meaning. Any change was purely unintentional.    Lannie Fields, PA-C 02/28/18 Burns [place], MD 02/28/18 2010

## 2018-02-28 NOTE — ED Triage Notes (Addendum)
Pt comes into the ED via POV c/o right shoulder pain that has been ongoing for a couple of weeks.  Patient has pain when pressure is placed on the shoulder but denies any known injury to it other than falling out of the bed 3 weeks ago.  Patient has full range of motion at this time and in NAD.

## 2018-07-28 ENCOUNTER — Emergency Department
Admission: EM | Admit: 2018-07-28 | Discharge: 2018-07-28 | Disposition: A | Discharge status: Home / Self Care | Payer: BLUE CROSS/BLUE SHIELD | Attending: Emergency Medicine | Admitting: Emergency Medicine

## 2018-07-28 ENCOUNTER — Encounter: Payer: Self-pay | Admitting: Emergency Medicine

## 2018-07-28 ENCOUNTER — Emergency Department: Payer: BLUE CROSS/BLUE SHIELD

## 2018-07-28 ENCOUNTER — Other Ambulatory Visit: Payer: Self-pay

## 2018-07-28 DIAGNOSIS — J181 Lobar pneumonia, unspecified organism: Secondary | ICD-10-CM

## 2018-07-28 DIAGNOSIS — J189 Pneumonia, unspecified organism: Secondary | ICD-10-CM | POA: Insufficient documentation

## 2018-07-28 DIAGNOSIS — F17210 Nicotine dependence, cigarettes, uncomplicated: Secondary | ICD-10-CM | POA: Diagnosis not present

## 2018-07-28 DIAGNOSIS — Z79899 Other long term (current) drug therapy: Secondary | ICD-10-CM | POA: Insufficient documentation

## 2018-07-28 DIAGNOSIS — R05 Cough: Secondary | ICD-10-CM | POA: Diagnosis present

## 2018-07-28 MED ORDER — HYDROCOD POLST-CPM POLST ER 10-8 MG/5ML PO SUER: 5.0000 mL | Freq: Two times a day (BID) | ORAL | 0 refills | Status: DC | PRN

## 2018-07-28 MED ORDER — LEVOFLOXACIN 500 MG PO TABS: 500.0000 mg | ORAL_TABLET | Freq: Every day | ORAL | 0 refills | Status: DC

## 2018-07-28 NOTE — ED Triage Notes (Signed)
Pt arrived with complaints of lingering cough for the last week. Pt states his cough is productive.Pt in NAD in triage. Pt denies any pain unless he is coughing.

## 2018-07-28 NOTE — ED Notes (Signed)
First Nurse Note: pt to ED c/o cough. Pt able to speak in complete sentences. Pt is in NAD.

## 2018-07-28 NOTE — ED Provider Notes (Signed)
[facility] Emergency Department Provider Note  ____________________________________________   First MD Initiated Contact with Patient 07/28/18 1829     (approximate)  I have reviewed the triage vital signs and the nursing notes.   HISTORY  Chief Complaint Cough    HPI Edward Soto is a 42 y.o. male presents emergency department complaining of cough and congestion with yellow to green mucus.  Symptoms for 7-10 days.  States mild fever yesterday.  Denies chills, chest pain or shortness of breath.  States the coughing is much worse when he lies down.  He denies any swelling in the extremities.   Past Medical History:  Diagnosis Date  . Depression   . DVT (deep venous thrombosis) (HCC)    right leg  . H/O seasonal allergies     There are no active problems to display for this patient.   Past Surgical History:  Procedure Laterality Date  . FRACTURE SURGERY Right   . KNEE ARTHROSCOPY Right   . 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 Authorizing Provider  buPROPion (WELLBUTRIN XL) 150 MG 24 hr tablet Take 150 mg by mouth daily.    [provider]  chlorpheniramine-HYDROcodone (TUSSIONEX PENNKINETIC ER) 10-8 MG/5ML SUER Take 5 mLs by mouth every 12 (twelve) hours as needed for cough. 07/28/18   Vinh Sachs, Linden Dolin, PA-C  clonazePAM (KLONOPIN) 0.5 MG tablet Take 0.5 mg by mouth 2 (two) times daily as needed for anxiety.    [provider]  FLUoxetine (PROZAC) 10 MG capsule Take 10 mg by mouth daily.    [provider]  levofloxacin (LEVAQUIN) 500 MG tablet Take 1 tablet (500 mg total) by mouth daily. 07/28/18   Raetta Agostinelli, Linden Dolin, PA-C  zaleplon (SONATA) 10 MG capsule Take 10 mg by mouth at bedtime  as needed for sleep.    [provider]    Allergies Patient has no known allergies.  No family history on file.  Social History Social History   Tobacco Use  . Smoking status: Current Some Day Smoker    Packs/day: 0.50    Years: 17.00    Pack years: 8.50    Types: Cigarettes  . Smokeless tobacco: Never Used  Substance Use Topics  . Alcohol use: No  . Drug use: No    Review of Systems  Constitutional: No fever/chills Eyes: No visual changes. ENT: No sore throat. Respiratory: Positive cough and congestion, positive wheezing Genitourinary: Negative for dysuria. Musculoskeletal: Negative for back pain. Skin: Negative for rash.    ____________________________________________   PHYSICAL EXAM:  VITAL SIGNS: ED Triage Vitals  Enc Vitals Group     BP 07/28/18 1738 (!) 155/85     Pulse Rate 07/28/18 1738 87     Resp 07/28/18 1738 20     Temp 07/28/18 1738 98.7 F (37.1 C)     Temp Source 07/28/18 1738 Oral     SpO2 07/28/18 1738 96 %     Weight 07/28/18 1739 (!) 362 lb (164.2 kg)     Height 07/28/18 1739 6\' 2"  (1.88 m)     Head Circumference --      Peak Flow --      Pain Score 07/28/18 1739 0     Pain  Loc --      Pain Edu? --      Excl. in [place]? --     Constitutional: Alert and oriented. Well appearing and in no acute distress. Eyes: Conjunctivae are normal.  Head: Atraumatic. ENT: [place] clear bilaterally Nose: No congestion/rhinnorhea. Mouth/Throat: Mucous membranes are moist.   NECK: Is supple, no lymphadenopathy is noted  cardiovascular: Normal rate, regular rhythm.  Heart sounds are normal Respiratory: Normal respiratory effort.  No retractions, lungs clear to auscultation GU: deferred Musculoskeletal: FROM all extremities, warm and well perfused Neurologic:  Normal speech and language.  Skin:  Skin is warm, dry and intact. No rash noted. Psychiatric: Mood and affect are normal. Speech and behavior are  normal.  ____________________________________________   LABS (all labs ordered are listed, but only abnormal results are displayed)  Labs Reviewed - No data to display ____________________________________________   ____________________________________________  RADIOLOGY  Chest x-ray shows a left lower lobe pneumonia  ____________________________________________   PROCEDURES  Procedure(s) performed: No  Procedures    ____________________________________________   INITIAL IMPRESSION / ASSESSMENT AND PLAN / ED COURSE  Pertinent labs & imaging results that were available during my care of the patient were reviewed by me and considered in my medical decision making (see chart for details).   Patient is a 42 year old male presents emergency department complaining of cough and congestion.  Symptoms for 7 to 10 days.  Physical exam patient appears nontoxic.  Lungs clear to auscultation.  No pedal edema is noted.  Remainder the exam is unremarkable.  Chest x-ray shows a left lower lobe pneumonia.  Explained chest x-ray findings to the patient and his wife.  He was given a prescription for Levaquin 500 daily for 7 days.  A prescription for Tussionex for the cough.  He was given instructions to use this only at night if possible.  He can continue his DayQuil if needed during the day.  He states he understands will comply.  He is to follow-up with his regular doctor if not better in 3 to 5 days.  Return emergency department if worsening.  Follow-up with his regular doctor in 2 to 3 weeks for repeat chest x-ray.  He states he understands and will comply.  Was discharged in stable condition.      As part of my medical decision making, I reviewed the following data within the [place] History obtained from family, Nursing notes reviewed and incorporated, Old chart reviewed, Radiograph reviewed chest x-ray shows a left lower lobe pneumonia, Notes from prior ED visits  and [place] Controlled Substance Database  ____________________________________________   FINAL CLINICAL IMPRESSION(S) / ED DIAGNOSES  Final diagnoses:  Community acquired pneumonia of left lower lobe of lung ([place])      NEW MEDICATIONS STARTED DURING THIS VISIT:  New Prescriptions   CHLORPHENIRAMINE-HYDROCODONE (TUSSIONEX PENNKINETIC ER) 10-8 MG/5ML SUER    Take 5 mLs by mouth every 12 (twelve) hours as needed for cough.   LEVOFLOXACIN (LEVAQUIN) 500 MG TABLET    Take 1 tablet (500 mg total) by mouth daily.     Note:  This document was prepared using Dragon voice recognition software and may include unintentional dictation errors.     Versie Starks, PA-C 07/28/18 1900    Nance Pear, MD 07/28/18 [phone]

## 2018-07-28 NOTE — Discharge Instructions (Signed)
Take medications as prescribed.  If you are worsening please try emergency department.  If you are not better in 3 to 5 days please follow-up with the regular doctor.  You will also need to see your regular doctor in approximately 2 to 3 weeks for repeat chest x-ray.

## 2018-08-30 ENCOUNTER — Encounter: Payer: Self-pay | Admitting: Emergency Medicine

## 2018-08-30 ENCOUNTER — Emergency Department
Admission: EM | Admit: 2018-08-30 | Discharge: 2018-08-30 | Disposition: A | Discharge status: Home / Self Care | Payer: BLUE CROSS/BLUE SHIELD | Attending: Emergency Medicine | Admitting: Emergency Medicine

## 2018-08-30 ENCOUNTER — Other Ambulatory Visit: Payer: Self-pay

## 2018-08-30 DIAGNOSIS — Z79899 Other long term (current) drug therapy: Secondary | ICD-10-CM | POA: Diagnosis not present

## 2018-08-30 DIAGNOSIS — F329 Major depressive disorder, single episode, unspecified: Secondary | ICD-10-CM | POA: Diagnosis not present

## 2018-08-30 DIAGNOSIS — F17210 Nicotine dependence, cigarettes, uncomplicated: Secondary | ICD-10-CM | POA: Insufficient documentation

## 2018-08-30 DIAGNOSIS — M25521 Pain in right elbow: Secondary | ICD-10-CM | POA: Diagnosis present

## 2018-08-30 DIAGNOSIS — M7711 Lateral epicondylitis, right elbow: Secondary | ICD-10-CM

## 2018-08-30 MED ORDER — DICLOFENAC SODIUM 50 MG PO TBEC: 50.0000 mg | DELAYED_RELEASE_TABLET | Freq: Two times a day (BID) | ORAL | 0 refills | Status: AC

## 2018-08-30 NOTE — ED Triage Notes (Signed)
Pt via pov from home with right elbow pain x 1 month. States he cannot get an appoinment with his pcp for another month. Reports that sometimes it causes pain to shoot down into his fingertips. Pt alert & oriented with NAD noted.

## 2018-08-30 NOTE — ED Provider Notes (Signed)
[facility] Emergency Department Provider Note ____________________________________________  Time seen: 1928  I have reviewed the triage vital signs and the nursing notes.  HISTORY  Chief Complaint  Elbow Pain  HPI Edward Soto is a 43 y.o.right-handed male who presents to the ED for evaluation of right lateral elbow pain for the last month. He denies any particular injury or trauma, but does admit to helping a friend with some painting work. He describes pain to the lateral elbow, withpins & needles and shocking pains down the forearm. He denies any grip changes or hand swelling. He has been taking ibuprofen with limited benefit. He has attempted to see his PCP, but she is not available until next month. He denies any history of previous or ongoing hand paresthesias, cervical radiculopathy, or epicondylitis.   Past Medical History:  Diagnosis Date  . Depression   . DVT (deep venous thrombosis) (HCC)    right leg  . H/O seasonal allergies     There are no active problems to display for this patient.   Past Surgical History:  Procedure Laterality Date  . FRACTURE SURGERY Right   . KNEE ARTHROSCOPY Right   . MASS EXCISION Right 09/24/2013   Procedure: EXCISION MASS;  Surgeon: Newt Minion, MD;  Location: [place];  Service: Orthopedics;  Laterality: Right;  Right Foot Excision Dorsal and Plantar Hemangioma    Prior to Admission medications   Medication Sig Start Date End Date Taking? Authorizing Provider  cariprazine (VRAYLAR) capsule Take 3 mg by mouth daily.   Yes [provider]  clonazePAM (KLONOPIN) 0.5 MG tablet Take 0.5 mg by mouth 2 (two) times daily as needed for anxiety.    [provider]  diclofenac (VOLTAREN) 50 MG EC tablet Take 1 tablet (50 mg total) by mouth 2 (two) times daily. 08/30/18 09/29/18  Ragina Fenter, Dannielle Karvonen, PA-C    Allergies Patient has no known allergies.  History reviewed. No pertinent family  history.  Social History Social History   Tobacco Use  . Smoking status: Current Every Day Smoker    Packs/day: 1.00    Years: 17.00    Pack years: 17.00    Types: Cigarettes  . Smokeless tobacco: Former Network engineer Use Topics  . Alcohol use: Yes    Comment: occasional  . Drug use: No    Review of Systems  Constitutional: Negative for fever. Eyes: Negative for visual changes. ENT: Negative for sore throat. Cardiovascular: Negative for chest pain. Respiratory: Negative for shortness of breath. Gastrointestinal: Negative for abdominal pain, vomiting and diarrhea. Genitourinary: Negative for dysuria. Musculoskeletal: Negative for back pain. Skin: Negative for rash. Neurological: Negative for headaches, focal weakness or numbness. ____________________________________________  PHYSICAL EXAM:  VITAL SIGNS: ED Triage Vitals  Enc Vitals Group     BP 08/30/18 1840 (!) 133/97     Pulse Rate 08/30/18 1840 88     Resp 08/30/18 1840 18     Temp 08/30/18 1840 98.2 F (36.8 C)     Temp Source 08/30/18 1840 Oral     SpO2 08/30/18 1840 94 %     Weight 08/30/18 1841 (!) 360 lb (163.3 kg)     Height 08/30/18 1841 6\' 2"  (1.88 m)     Head Circumference --      Peak Flow --      Pain Score 08/30/18 1841 8     Pain Loc --      Pain Edu? --  Excl. in [place]? --     Constitutional: Alert and oriented. Well appearing and in no distress. Head: Normocephalic and atraumatic. Eyes: Conjunctivae are normal. Normal extraocular movements Cardiovascular: Normal rate, regular rhythm. Normal distal pulses. Respiratory: Normal respiratory effort. No wheezes/rales/rhonchi. Musculoskeletal: Right elbow without deformity, effusion or dislocation. normal flexion/extension ROM. Mildly tender to palp over the lateral epicondyle. normal composite fists. Nontender with normal range of motion in all extremities.  Neurologic:  Normal gross sensation. Normal intrinsic/opposition testing. Negative  cubital tinel's.  Normal speech and language. No gross focal neurologic deficits are appreciated. Skin:  Skin is warm, dry and intact. No rash noted. ____________________________________________  PROCEDURES  Procedures ____________________________________________  INITIAL IMPRESSION / ASSESSMENT AND PLAN / ED COURSE  Patient with ED evaluation of right lateral elbow pain consistent with lateral epicondylitis.  Patient is given a prescription for diclofenac to take in lieu of ibuprofen.  He is also encouraged to follow-up with his primary care provider or orthopedics for ongoing symptom management.  He is advised that he may consider an over-the-counter elbow strap for support and pain relief. ____________________________________________  FINAL CLINICAL IMPRESSION(S) / ED DIAGNOSES  Final diagnoses:  Epicondylitis, lateral, right      Anetha Slagel, Dannielle Karvonen, PA-C 08/30/18 2015    Delman Kitten, MD 08/31/18 [phone]

## 2018-08-30 NOTE — ED Notes (Signed)
Pt verbalized understanding of discharge instructions. NAD at this time. 

## 2018-08-30 NOTE — Discharge Instructions (Signed)
Your exam is consistent with tennis elbow. This is caused by strain and tendinitis to the muscle attachment on the outside of the elbow. Take the prescription anti-inflammatory and use ice compresses. Follow-up with your provider next month, as scheduled, or see Dr. Roland Rack.

## 2019-08-17 ENCOUNTER — Emergency Department: Payer: Self-pay

## 2019-08-17 ENCOUNTER — Emergency Department
Admission: EM | Admit: 2019-08-17 | Discharge: 2019-08-18 | Disposition: A | Discharge status: Home / Self Care | Payer: Self-pay | Attending: Emergency Medicine | Admitting: Emergency Medicine

## 2019-08-17 ENCOUNTER — Other Ambulatory Visit: Payer: Self-pay

## 2019-08-17 DIAGNOSIS — R202 Paresthesia of skin: Secondary | ICD-10-CM | POA: Insufficient documentation

## 2019-08-17 DIAGNOSIS — M79604 Pain in right leg: Secondary | ICD-10-CM | POA: Insufficient documentation

## 2019-08-17 DIAGNOSIS — Z79899 Other long term (current) drug therapy: Secondary | ICD-10-CM | POA: Insufficient documentation

## 2019-08-17 DIAGNOSIS — F17210 Nicotine dependence, cigarettes, uncomplicated: Secondary | ICD-10-CM | POA: Insufficient documentation

## 2019-08-17 DIAGNOSIS — G5711 Meralgia paresthetica, right lower limb: Secondary | ICD-10-CM | POA: Insufficient documentation

## 2019-08-17 LAB — CBC WITH DIFFERENTIAL/PLATELET
Abs Immature Granulocytes: 0.03 10*3/uL (ref 0.00–0.07)
Basophils Absolute: 0.1 10*3/uL (ref 0.0–0.1)
Basophils Relative: 1 %
Eosinophils Absolute: 0.2 10*3/uL (ref 0.0–0.5)
Eosinophils Relative: 2 %
HCT: 46.3 % (ref 39.0–52.0)
Hemoglobin: 15.2 g/dL (ref 13.0–17.0)
Immature Granulocytes: 0 %
Lymphocytes Relative: 25 %
Lymphs Abs: 2.2 10*3/uL (ref 0.7–4.0)
MCH: 32.1 pg (ref 26.0–34.0)
MCHC: 32.8 g/dL (ref 30.0–36.0)
MCV: 97.7 fL (ref 80.0–100.0)
Monocytes Absolute: 0.5 10*3/uL (ref 0.1–1.0)
Monocytes Relative: 6 %
Neutro Abs: 5.6 10*3/uL (ref 1.7–7.7)
Neutrophils Relative %: 66 %
Platelets: 182 10*3/uL (ref 150–400)
RBC: 4.74 MIL/uL (ref 4.22–5.81)
RDW: 11.9 % (ref 11.5–15.5)
WBC: 8.6 10*3/uL (ref 4.0–10.5)
nRBC: 0 % (ref 0.0–0.2)

## 2019-08-17 LAB — COMPREHENSIVE METABOLIC PANEL
ALT: 37 U/L (ref 0–44)
AST: 26 U/L (ref 15–41)
Albumin: 4.3 g/dL (ref 3.5–5.0)
Alkaline Phosphatase: 79 U/L (ref 38–126)
Anion gap: 11 (ref 5–15)
BUN: 16 mg/dL (ref 6–20)
CO2: 27 mmol/L (ref 22–32)
Calcium: 9 mg/dL (ref 8.9–10.3)
Chloride: 102 mmol/L (ref 98–111)
Creatinine, Ser: 1.2 mg/dL (ref 0.61–1.24)
GFR calc Af Amer: >60 mL/min (ref >60)
GFR calc non Af Amer: >60 mL/min (ref >60)
Glucose, Bld: 142 mg/dL — ABNORMAL HIGH (ref 70–99)
Potassium: 3.7 mmol/L (ref 3.5–5.1)
Sodium: 140 mmol/L (ref 135–145)
Total Bilirubin: 0.6 mg/dL (ref 0.3–1.2)
Total Protein: 7.3 g/dL (ref 6.5–8.1)

## 2019-08-17 LAB — APTT: aPTT: 34 seconds (ref 24–36)

## 2019-08-17 LAB — PROTIME-INR
INR: 0.9 (ref 0.8–1.2)
Prothrombin Time: 12.5 seconds (ref 11.4–15.2)

## 2019-08-17 NOTE — ED Notes (Signed)
Pt is currently in ultrasound by will be in room 11 when that is complete.

## 2019-08-17 NOTE — ED Triage Notes (Signed)
Patient c/o upper/lateral, right leg numbness/tingling. Patient reports hx of DVT left leg - reports this feels the same.

## 2019-08-17 NOTE — ED Notes (Signed)
Pt back from ultrasound. Sitting on side of bed in nad. vss.

## 2019-08-17 NOTE — ED Provider Notes (Signed)
[facility] Emergency Department Provider Note   ____________________________________________   First MD Initiated Contact with Patient 08/17/19 2303     (approximate)  I have reviewed the triage vital signs and the nursing notes.   HISTORY  Chief Complaint Leg Pain    HPI Edward Soto is a 43 y.o. male with past medical history of DVT who presents to the ED complaining of leg pain.  Patient reports approximately 1 week of throbbing pain to his right lateral thigh.  He denies any recent falls or injuries to the area.  He has not noticed any significant swelling and he has been able to bear weight on the right leg without difficulty.  He has not noticed any overlying skin changes.  He does state that he will occasionally feel numbness and tingling in the area of discomfort.  He is concerned that he might have a clot in his leg, as he had one of these before but previously completed anticoagulation.        Past Medical History:  Diagnosis Date  . Depression   . DVT (deep venous thrombosis) (HCC)    right leg  . H/O seasonal allergies     There are no problems to display for this patient.   Past Surgical History:  Procedure Laterality Date  . FRACTURE SURGERY Right   . KNEE ARTHROSCOPY Right   . MASS EXCISION Right 09/24/2013   Procedure: EXCISION MASS;  Surgeon: Newt Minion, MD;  Location: [place];  Service: Orthopedics;  Laterality: Right;  Right Foot Excision Dorsal and Plantar Hemangioma    Prior to Admission medications   Medication Sig Start Date End Date Taking? Authorizing Provider  cariprazine (VRAYLAR) capsule Take 3 mg by mouth daily.    [provider]  clonazePAM (KLONOPIN) 0.5 MG tablet Take 0.5 mg by mouth 2 (two) times daily as needed for anxiety.    [provider]    Allergies Patient has no known allergies.  No family history on file.  Social History Social History   Tobacco Use  . Smoking status:  Current Every Day Smoker    Packs/day: 1.00    Years: 17.00    Pack years: 17.00    Types: Cigarettes  . Smokeless tobacco: Former Network engineer Use Topics  . Alcohol use: Yes    Comment: occasional  . Drug use: No    Review of Systems  Constitutional: No fever/chills Eyes: No visual changes. ENT: No sore throat. Cardiovascular: Denies chest pain. Respiratory: Denies shortness of breath. Gastrointestinal: No abdominal pain.  No nausea, no vomiting.  No diarrhea.  No constipation. Genitourinary: Negative for dysuria. Musculoskeletal: Positive for right leg pain. Skin: Negative for rash. Neurological: Negative for headaches, focal weakness or numbness.  ____________________________________________   PHYSICAL EXAM:  VITAL SIGNS: ED Triage Vitals  Enc Vitals Group     BP 08/17/19 2144 (!) 152/85     Pulse Rate 08/17/19 2144 85     Resp 08/17/19 2144 20     Temp 08/17/19 2144 98.3 F (36.8 C)     Temp Source 08/17/19 2144 Oral     SpO2 08/17/19 2144 96 %     Weight 08/17/19 2147 (!) 350 lb (158.8 kg)     Height 08/17/19 2144 6\' 2"  (1.88 m)     Head Circumference --      Peak Flow --      Pain Score 08/17/19 2146 5     Pain  Loc --      Pain Edu? --      Excl. in [place]? --     Constitutional: Alert and oriented. Eyes: Conjunctivae are normal. Head: Atraumatic. Nose: No congestion/rhinnorhea. Mouth/Throat: Mucous membranes are moist. Neck: Normal ROM Cardiovascular: Normal rate, regular rhythm. Grossly normal heart sounds.  2+ DP pulses bilaterally. Respiratory: Normal respiratory effort.  No retractions. Lungs CTAB. Gastrointestinal: Soft and nontender. No distention. Genitourinary: deferred Musculoskeletal: No lower extremity tenderness nor edema. Neurologic:  Normal speech and language. No gross focal neurologic deficits are appreciated. Skin:  Skin is warm, dry and intact. No rash noted. Psychiatric: Mood and affect are normal. Speech and behavior are  normal.  ____________________________________________   LABS (all labs ordered are listed, but only abnormal results are displayed)  Labs Reviewed  COMPREHENSIVE METABOLIC PANEL - Abnormal; Notable for the following components:      Result Value   Glucose, Bld 142 (*)    All other components within normal limits  CBC WITH DIFFERENTIAL/PLATELET  APTT  PROTIME-INR    PROCEDURES  Procedure(s) performed (including Critical Care):  Procedures   ____________________________________________   INITIAL IMPRESSION / ASSESSMENT AND PLAN / ED COURSE       43 year old male with history of DVT, previously completed anticoagulation, presents to the ED with approximately 1 week of throbbing pain as well as numbness and tingling to his right lateral thigh.  He is neurovascularly intact to his bilateral lower extremities with 2+ DP pulses, strength sensation intact.  There is no significant edema noted and no skin changes to suggest cellulitis.  Ultrasound was negative for DVT and his lab work is unremarkable.  Symptoms sound consistent with meralgia paresthetica given the location.  I have counseled him to use Tylenol and NSAIDs as needed, counseled to follow-up with his PCP and to return to the ED for new or worsening symptoms.  Patient agrees with plan.      ____________________________________________   FINAL CLINICAL IMPRESSION(S) / ED DIAGNOSES  Final diagnoses:  Right leg pain  Paresthesia  Meralgia paresthetica of right side     ED Discharge Orders    None       Note:  This document was prepared using Dragon voice recognition software and may include unintentional dictation errors.   Blake Divine, MD 08/18/19 [phone]

## 2019-08-18 NOTE — ED Notes (Signed)
Patient discharged to home per MD order. Patient in stable condition, and deemed medically cleared by ED provider for discharge. Discharge instructions reviewed with patient/family using "Teach Back"; verbalized understanding of medication education and administration, and information about follow-up care. Denies further concerns. ° °

## 2019-10-03 ENCOUNTER — Other Ambulatory Visit: Payer: Self-pay | Admitting: Physician Assistant

## 2019-10-03 ENCOUNTER — Ambulatory Visit
Admission: RE | Admit: 2019-10-03 | Discharge: 2019-10-03 | Disposition: A | Discharge status: Home / Self Care | Payer: Self-pay | Source: Ambulatory Visit | Attending: Physician Assistant | Admitting: Physician Assistant

## 2019-10-03 DIAGNOSIS — M545 Low back pain, unspecified: Secondary | ICD-10-CM

## 2019-10-29 ENCOUNTER — Emergency Department
Admission: EM | Admit: 2019-10-29 | Discharge: 2019-10-29 | Disposition: A | Discharge status: Home / Self Care | Payer: Self-pay | Attending: Emergency Medicine | Admitting: Emergency Medicine

## 2019-10-29 ENCOUNTER — Other Ambulatory Visit: Payer: Self-pay

## 2019-10-29 DIAGNOSIS — F17210 Nicotine dependence, cigarettes, uncomplicated: Secondary | ICD-10-CM | POA: Insufficient documentation

## 2019-10-29 DIAGNOSIS — Z79899 Other long term (current) drug therapy: Secondary | ICD-10-CM | POA: Insufficient documentation

## 2019-10-29 DIAGNOSIS — H00011 Hordeolum externum right upper eyelid: Secondary | ICD-10-CM | POA: Insufficient documentation

## 2019-10-29 MED ORDER — SULFAMETHOXAZOLE-TRIMETHOPRIM 800-160 MG PO TABS: 1.0000 | ORAL_TABLET | Freq: Two times a day (BID) | ORAL | 0 refills | Status: AC

## 2019-10-29 NOTE — Discharge Instructions (Signed)
Apply warm compresses to right eye 3 times daily for the next 7 days. Take Bactrim twice daily for the next 7 days.

## 2019-10-29 NOTE — ED Triage Notes (Signed)
Pt woke up with red, swollen area on right eyelid. Pt states it has never happened before.

## 2019-10-29 NOTE — ED Provider Notes (Signed)
Emergency Department Provider Note  ____________________________________________  Time seen: Approximately 7:06 PM  I have reviewed the triage vital signs and the nursing notes.   HISTORY  Chief Complaint Eye Problem   Historian Patient   HPI Edward Soto is a 44 y.o. male presents to the emergency department with erythema of the right eyelid that started today.  Patient states he has noticed purulent exudate in the medial canthi.  Denies any history of similar symptoms in the past.  Patient states that he tried warm compresses but states that symptoms have not improved.   Past Medical History:  Diagnosis Date  . Depression   . DVT (deep venous thrombosis) (HCC)    right leg  . H/O seasonal allergies      Immunizations up to date:  Yes.     Past Medical History:  Diagnosis Date  . Depression   . DVT (deep venous thrombosis) (HCC)    right leg  . H/O seasonal allergies     There are no problems to display for this patient.   Past Surgical History:  Procedure Laterality Date  . FRACTURE SURGERY Right   . KNEE ARTHROSCOPY Right   . MASS EXCISION Right 09/24/2013   Procedure: EXCISION MASS;  Surgeon: Newt Minion, MD;  Location: [place];  Service: Orthopedics;  Laterality: Right;  Right Foot Excision Dorsal and Plantar Hemangioma    Prior to Admission medications   Medication Sig Start Date End Date Taking? Authorizing Provider  cariprazine (VRAYLAR) capsule Take 3 mg by mouth daily.    [provider]  clonazePAM (KLONOPIN) 0.5 MG tablet Take 0.5 mg by mouth 2 (two) times daily as needed for anxiety.    [provider]  sulfamethoxazole-trimethoprim (BACTRIM DS) 800-160 MG tablet Take 1 tablet by mouth 2 (two) times daily for 7 days. 10/29/19 11/05/19  Lannie Fields, PA-C    Allergies Patient has no known allergies.  History reviewed. No pertinent family history.  Social History Social History   Tobacco Use  . Smoking status: Current  Every Day Smoker    Packs/day: 1.00    Years: 17.00    Pack years: 17.00    Types: Cigarettes  . Smokeless tobacco: Former Network engineer Use Topics  . Alcohol use: Yes    Comment: occasional  . Drug use: No     Review of Systems  Constitutional: No fever/chills Eyes: Patient has hordeolum ENT: No upper respiratory complaints. Respiratory: no cough. No SOB/ use of accessory muscles to breath Gastrointestinal:   No nausea, no vomiting.  No diarrhea.  No constipation. Musculoskeletal: Negative for musculoskeletal pain. Skin: Negative for rash, abrasions, lacerations, ecchymosis.    ____________________________________________   PHYSICAL EXAM:  VITAL SIGNS: ED Triage Vitals  Enc Vitals Group     BP 10/29/19 1802 (!) 141/81     Pulse Rate 10/29/19 1802 75     Resp 10/29/19 1802 16     Temp 10/29/19 1802 99.1 F (37.3 C)     Temp Source 10/29/19 1802 Oral     SpO2 10/29/19 1802 93 %     Weight 10/29/19 1801 (!) 438 lb (198.7 kg)     Height 10/29/19 1801 6\' 2"  (1.88 m)     Head Circumference --      Peak Flow --      Pain Score 10/29/19 1801 4     Pain Loc --      Pain Edu? --  Excl. in [place]? --      Constitutional: Alert and oriented. Well appearing and in no acute distress. Eyes: Conjunctivae are normal. PERRL. EOMI. right-sided hordeolum visualized. Head: Atraumatic. ENT:      Ears:       Nose: No congestion/rhinnorhea.      Mouth/Throat: Mucous membranes are moist.  Neck: No stridor.  No cervical spine tenderness to palpation. Cardiovascular: Normal rate, regular rhythm. Normal S1 and S2.  Good peripheral circulation. Respiratory: Normal respiratory effort without tachypnea or retractions. Lungs CTAB. Good air entry to the bases with no decreased or absent breath sounds Skin:  Skin is warm, dry and intact. No rash noted. Psychiatric: Mood and affect are normal for age. Speech and behavior are normal.   ____________________________________________    LABS (all labs ordered are listed, but only abnormal results are displayed)  Labs Reviewed - No data to display ____________________________________________  EKG   ____________________________________________  RADIOLOGY   No results found.  ____________________________________________    PROCEDURES  Procedure(s) performed:     Procedures     Medications - No data to display   ____________________________________________   INITIAL IMPRESSION / ASSESSMENT AND PLAN / ED COURSE  Pertinent labs & imaging results that were available during my care of the patient were reviewed by me and considered in my medical decision making (see chart for details).    Assessment and plan 44 year old woman 44 year old male presents to the emergency department with right upper eyelid hordeolum.  Patient was advised to apply warm compresses 3 times daily for the next week.  He was also discharged with Bactrim.  Return precautions were given to return with new or worsening symptoms.  All patient questions were answered.   ____________________________________________  FINAL CLINICAL IMPRESSION(S) / ED DIAGNOSES  Final diagnoses:  Hordeolum externum of right upper eyelid      NEW MEDICATIONS STARTED DURING THIS VISIT:  ED Discharge Orders         Ordered    sulfamethoxazole-trimethoprim (BACTRIM DS) 800-160 MG tablet  2 times daily     10/29/19 1904              This chart was dictated using voice recognition software/Dragon. Despite best efforts to proofread, errors can occur which can change the meaning. Any change was purely unintentional.     Lannie Fields, PA-C 10/29/19 Oneal Deputy, MD 10/29/19 2242

## 2020-03-02 ENCOUNTER — Other Ambulatory Visit: Payer: Self-pay | Admitting: Orthopedic Surgery

## 2020-03-02 DIAGNOSIS — M4807 Spinal stenosis, lumbosacral region: Secondary | ICD-10-CM

## 2020-03-16 ENCOUNTER — Ambulatory Visit
Admission: RE | Admit: 2020-03-16 | Discharge: 2020-03-16 | Disposition: A | Discharge status: Home / Self Care | Payer: Self-pay | Source: Ambulatory Visit | Attending: Orthopedic Surgery | Admitting: Orthopedic Surgery

## 2020-03-16 ENCOUNTER — Other Ambulatory Visit: Payer: Self-pay

## 2020-03-16 DIAGNOSIS — M4807 Spinal stenosis, lumbosacral region: Secondary | ICD-10-CM | POA: Insufficient documentation

## 2020-03-30 DIAGNOSIS — Z20822 Contact with and (suspected) exposure to covid-19: Secondary | ICD-10-CM | POA: Diagnosis not present

## 2020-05-04 DIAGNOSIS — Z20822 Contact with and (suspected) exposure to covid-19: Secondary | ICD-10-CM | POA: Diagnosis not present

## 2020-08-17 ENCOUNTER — Other Ambulatory Visit (HOSPITAL_COMMUNITY): Payer: Managed Care, Other (non HMO) | Admitting: Internal Medicine

## 2020-08-17 DIAGNOSIS — S271XXS Traumatic hemothorax, sequela: Secondary | ICD-10-CM

## 2020-08-17 DIAGNOSIS — J690 Pneumonitis due to inhalation of food and vomit: Secondary | ICD-10-CM | POA: Diagnosis not present

## 2020-08-17 DIAGNOSIS — J9621 Acute and chronic respiratory failure with hypoxia: Secondary | ICD-10-CM | POA: Diagnosis not present

## 2020-08-17 DIAGNOSIS — G9340 Encephalopathy, unspecified: Secondary | ICD-10-CM | POA: Diagnosis not present

## 2020-08-18 ENCOUNTER — Encounter: Payer: Self-pay | Admitting: Internal Medicine

## 2020-08-18 DIAGNOSIS — S271XXA Traumatic hemothorax, initial encounter: Secondary | ICD-10-CM | POA: Insufficient documentation

## 2020-08-18 DIAGNOSIS — G9340 Encephalopathy, unspecified: Secondary | ICD-10-CM | POA: Insufficient documentation

## 2020-08-18 DIAGNOSIS — J690 Pneumonitis due to inhalation of food and vomit: Secondary | ICD-10-CM | POA: Insufficient documentation

## 2020-08-18 DIAGNOSIS — J9621 Acute and chronic respiratory failure with hypoxia: Secondary | ICD-10-CM | POA: Insufficient documentation

## 2020-08-18 NOTE — Progress Notes (Signed)
[facility]  [facility] PULMONARY SERVICE  Date of Service: 08/17/2020  PULMONARY CONSULT   Edward Soto  VIF:[id number]  DOB: 04-14-1976     Referring Physician: Larena Glassman, MD  HPI: Edward Soto is a 44 y.o. male seen for Acute on Chronic Respiratory Failure.  Patient has multiple medical problems including sleep apnea DVT diabetes encephalopathy renal failure renal insufficiency smoker came into the hospital because of bruise which was noted after a bout of coughing.  On evaluation with a CT scan patient was noted to have a hemothorax noted and extensive chest wall trauma.  Patient was admitted to the hospital had a rib plating and washout of the hematoma.  Patient underwent VATS and evacuation.  Apparently his course was complicated by development of intraoperative hypoxia fever and encephalopathy.  The patient was seen by ID at that time and was suspected to have serotonin syndrome also there was concern for malignant hyperthermia.  He underwent evaluation multiple bronchoscopies had treatment with dantrolene eventually cultures came back for Moraxella and also had Acetobacter.  Patient also was suspected to have aspiration pneumonia.  He is now transferred to our facility for further management.  At the time of evaluation he is on T collar and requiring 28% FiO2  Review of Systems:  ROS performed and is unremarkable other than noted above.  Past Medical History:  Diagnosis Date  . Depression   . DVT (deep venous thrombosis) (HCC)    right leg  . H/O seasonal allergies     Past Surgical History:  Procedure Laterality Date  . FRACTURE SURGERY Right   . KNEE ARTHROSCOPY Right   . MASS EXCISION Right 09/24/2013   Procedure: EXCISION MASS;  Surgeon: Nadara Mustard, MD;  Location: [facility] OR;  Service: Orthopedics;  Laterality: Right;  Right Foot Excision Dorsal and Plantar Hemangioma    Social History:    reports that he has been smoking cigarettes. He has a 17.00 pack-year  smoking history. He has quit using smokeless tobacco. He reports current alcohol use. He reports that he does not use drugs.  Family History: Non-Contributory to the present illness  Allergies  Reviewed on the [facility]  Medications: Reviewed on Rounds  Physical Exam:  Vitals: Temperature was 99.1 pulse 111 respiratory 18 blood pressure 99/70 saturations 94%  Ventilator Settings off the ventilator on T collar with an FiO2 of 28%  . General: Comfortable at this time . Eyes: Grossly normal lids, irises & conjunctiva . ENT: grossly tongue is normal . Neck: no obvious mass . Cardiovascular: S1-S2 normal no gallop or rub . Respiratory: Coarse rhonchi are noted bilaterally . Abdomen: Soft and nontender . Skin: no rash seen on limited exam . Musculoskeletal: not rigid . Psychiatric:unable to assess . Neurologic: no seizure no involuntary movements         Labs on Admission:  *Complete Blood Count (CBC) Specimen:  Blood  Ref Soto & Units 1 d ago  WBC (White Blood Cell Count) 3.2 - 9.8 x10^9/L 5.4   Hemoglobin 13.7 - 17.3 g/dL 1.4JWL   Hematocrit 29.5 - 49.0 % 25.0Low   Platelets 150 - 450 x10^9/L 229   MCV (Mean Corpuscular Volume) 80 - 98 fL 98   MCH (Mean Corpuscular Hemoglobin) 26.5 - 34.0 pg 30.2   MCHC (Mean Corpuscular Hemoglobin Concentration) 31.5 - 36.3 % 30.8Low   RBC (Red Blood Cell Count) 4.37 - 5.74 x10^12/L 2.55Low   RDW-CV (Red Cell Distribution Width) 11.5 - 14.5 % 18.9High  NRBC (Nucleated Red Blood Cell Count) 0 x10^9/L 0.00   NRBC % (Nucleated Red Blood Cell %) % 0.0   MPV (Mean Platelet Volume) 7.2 - 11.7 fL 10.2   Resulting Agency  [facility]    Renal Function Panel (RFP) Specimen:  Blood  Ref Soto & Units 2 d ago Comments  Sodium 135 - 145 mmol/L 133Low    Potassium 3.5 - 5.0 mmol/L 3.8    Chloride 98 - 108 mmol/L 98    Carbon Dioxide (CO2) 21 - 30 mmol/L 25    Urea Nitrogen (BUN) 7 - 20 mg/dL 12    Creatinine 0.6 - 1.3  mg/dL 0.6    Calcium 8.7 - 10.2 mg/dL 9.0    Phosphorus 2.3 - 4.5 mg/dL 4.4    Albumin 3.5 - 4.8 g/dL 2.4Low    Glomerular Filtration Rate (eGFR)  mL/min/1.73sq m 121      Radiological Exams on Admission:  HISTORY:  Shortness of breath   PROCEDURE:  AP PORTABLE VIEW OF CHEST  2328 hours   COMPARISON: None   FINDINGS:   A tracheostomy tube is present with tip well above carina. Nonspecific  bibasilar densities though favored to represent scarring or atelectasis.  Suspect small bilateral pleural effusions. Overall cardiomediastinal  silhouette does not appear enlarged allowing for technical factors.   IMPRESSION:   1.  Bibasilar densities favored to represent scarring or atelectasis.  Follow-up films may be beneficial.   Electronically Signed by: Willis Modena, MD, [facility] Radiology  Electronically Signed on: 08/15/2020 12:51 AM  Assessment/Plan Patient Active Problem List   Diagnosis Date Noted  . Acute on chronic respiratory failure with hypoxia ([place])   . Acute encephalopathy   . Traumatic hemothorax   . Aspiration pneumonia due to gastric secretions ([place])      1. Acute on chronic respiratory failure hypoxia patient is doing fine on T collar right now secretions are still quite copious however and patient requires frequent suctioning. 2. Acute encephalopathy slow improvement we will continue to monitor closely. 3. Hemothorax status post evacuation patient underwent VATS. 4. Aspiration pneumonia has been treated with antibiotics we will continue to monitor closely. 5. Necrotizing wound patient has had wound debridement and source of sepsis we will continue with the current wound management  I have personally seen and evaluated the patient, evaluated laboratory and imaging results, formulated the assessment and plan and placed orders. The Patient requires high complexity decision making with multiple systems involvement.  Case was discussed on Rounds with the  Respiratory Therapy Staff Time Spent 56minutes  Allyne Gee, MD [facility]

## 2020-08-19 ENCOUNTER — Other Ambulatory Visit (HOSPITAL_COMMUNITY): Payer: Managed Care, Other (non HMO) | Admitting: Internal Medicine

## 2020-08-19 DIAGNOSIS — J9621 Acute and chronic respiratory failure with hypoxia: Secondary | ICD-10-CM

## 2020-08-19 DIAGNOSIS — J690 Pneumonitis due to inhalation of food and vomit: Secondary | ICD-10-CM | POA: Diagnosis not present

## 2020-08-19 DIAGNOSIS — S271XXS Traumatic hemothorax, sequela: Secondary | ICD-10-CM

## 2020-08-19 DIAGNOSIS — G9340 Encephalopathy, unspecified: Secondary | ICD-10-CM

## 2020-08-19 NOTE — Progress Notes (Signed)
[place] NOTE  PULMONARY SERVICE ROUNDS  Date of Service: 08/19/2020  Edward Soto  DOB: 17-Apr-1976  Referring physician: Deanne Coffer, MD  HPI: Edward Soto is a 44 y.o. male  being seen for Acute on Chronic Respiratory Failure.  Patient currently is on capping doing fairly well  Review of Systems: Unremarkable other than noted in HPI  Allergies:  Reviewed on the [facility]  Medications: Reviewed  Vitals: Temperature is 98.5 pulse 115 respiratory rate 30 blood pressure is 164/73 saturations 94%  Ventilator Settings: Capping on 1 L oxygen  Physical Exam: . General:  calm and comfortable NAD . Eyes: normal lids, irises & conjunctiva . ENT: grossly normal tongue not enlarged . Neck: no masses . Cardiovascular: S1 S2 Normal no rubs no gallop . Respiratory: No rhonchi very coarse breath sounds . Abdomen: soft non-distended . Skin: no rash seen on limited exam . Musculoskeletal:  no rigidity . Psychiatric: unable to assess . Neurologic: no involuntary movements          Lab Data and radiological Data:  Sodium 142 potassium 4.6 BUN 17 creatinine 0.8 White count 5.7 hemoglobin 8.6 hematocrit 28.8 platelet count 313   Assessment/Plan  Patient Active Problem List   Diagnosis Date Noted  . Acute on chronic respiratory failure with hypoxia ([place])   . Acute encephalopathy   . Traumatic hemothorax   . Aspiration pneumonia due to gastric secretions ([place])       1. Acute on chronic respiratory failure with hypoxia plan is to continue with capping trials 2. Acute encephalopathy no change 3. Traumatic hemothorax drained 4. Aspiration pneumonia treated clinically is improving   I have personally evaluated the patient, evaluated the laboratory and imaging results and formulated the assessment and plan and placed orders as needed. The Patient requires high complexity decision making with multiple system involvement. Rounds were done with the  Respiratory Therapy Director and respiratory therapist involved in the care of the patient as well as nursing staff.   Allyne Gee, MD [facility] Pulmonary Critical Care Medicine   This note is for inpatient care

## 2020-08-21 ENCOUNTER — Other Ambulatory Visit (HOSPITAL_COMMUNITY): Payer: Managed Care, Other (non HMO) | Admitting: Internal Medicine

## 2020-08-21 DIAGNOSIS — G9340 Encephalopathy, unspecified: Secondary | ICD-10-CM

## 2020-08-21 DIAGNOSIS — J9621 Acute and chronic respiratory failure with hypoxia: Secondary | ICD-10-CM | POA: Diagnosis not present

## 2020-08-21 DIAGNOSIS — S271XXS Traumatic hemothorax, sequela: Secondary | ICD-10-CM

## 2020-08-21 DIAGNOSIS — J690 Pneumonitis due to inhalation of food and vomit: Secondary | ICD-10-CM | POA: Diagnosis not present

## 2020-08-21 NOTE — Progress Notes (Signed)
[place] NOTE  PULMONARY SERVICE ROUNDS  Date of Service: 08/21/2020  Edward Soto  DOB: 11/30/1975  Referring physician: Deanne Coffer, MD  HPI: Edward Soto is a 44 y.o. male  being seen for Acute on Chronic Respiratory Failure.  Patient is capping comfortable right now without distress at this time.  Review of Systems: Unremarkable other than noted in HPI  Allergies:  Reviewed on the [facility]  Medications: Reviewed  Vitals: Temperature is 98.0 pulse 97 respiratory rate 19 blood pressure is 140/81 saturations 96%  Ventilator Settings: Capping right now off off the ventilator at this time.  Physical Exam: . General:  calm and comfortable NAD . Eyes: normal lids, irises & conjunctiva . ENT: grossly normal tongue not enlarged . Neck: no masses . Cardiovascular: S1 S2 Normal no rubs no gallop . Respiratory: No rhonchi no rales noted . Abdomen: soft non-distended . Skin: no rash seen on limited exam . Musculoskeletal:  no rigidity . Psychiatric: unable to assess . Neurologic: no involuntary movements          Lab Data and radiological Data:  Labs have been reviewed   Assessment/Plan  Patient Active Problem List   Diagnosis Date Noted  . Acute on chronic respiratory failure with hypoxia ([place])   . Acute encephalopathy   . Traumatic hemothorax   . Aspiration pneumonia due to gastric secretions ([place])       1. Acute on chronic respiratory failure hypoxia we will continue with the capping trials off the ventilator. 2. Acute encephalopathy grossly unchanged 3. Traumatic hemothorax patient is at baseline 4. Aspiration pneumonia treated   I have personally evaluated the patient, evaluated the laboratory and imaging results and formulated the assessment and plan and placed orders as needed. The Patient requires high complexity decision making with multiple system involvement. Rounds were done with the Respiratory Therapy Director and  respiratory therapist involved in the care of the patient as well as nursing staff.   Allyne Gee, MD [facility] Pulmonary Critical Care Medicine   This note is for inpatient care

## 2020-08-23 ENCOUNTER — Other Ambulatory Visit (HOSPITAL_COMMUNITY): Payer: Managed Care, Other (non HMO) | Admitting: Internal Medicine

## 2020-08-23 DIAGNOSIS — S271XXS Traumatic hemothorax, sequela: Secondary | ICD-10-CM

## 2020-08-23 DIAGNOSIS — J9621 Acute and chronic respiratory failure with hypoxia: Secondary | ICD-10-CM

## 2020-08-23 DIAGNOSIS — J690 Pneumonitis due to inhalation of food and vomit: Secondary | ICD-10-CM

## 2020-08-23 DIAGNOSIS — G9340 Encephalopathy, unspecified: Secondary | ICD-10-CM

## 2020-08-23 NOTE — Progress Notes (Signed)
[place] NOTE  PULMONARY SERVICE ROUNDS  Date of Service: 08/23/2020  Edward Soto  DOB: [birth year]  Referring physician: Deanne Coffer, MD  HPI: Edward Soto is a 44 y.o. male  being seen for Acute on Chronic Respiratory Failure.  Patient currently is capping has done quite well ready for decannulation  Review of Systems: Unremarkable other than noted in HPI  Allergies:  Reviewed on the [facility]  Medications: Reviewed  Vitals: Temperature 98.7 pulse 92 respiratory rate 17 blood pressure is 100/74 saturations 98%  Ventilator Settings: Capping  Physical Exam: . General:  calm and comfortable NAD . Eyes: normal lids, irises & conjunctiva . ENT: grossly normal tongue not enlarged . Neck: no masses . Cardiovascular: S1 S2 Normal no rubs no gallop . Respiratory: No rhonchi no rales are noted at this time . Abdomen: soft non-distended . Skin: no rash seen on limited exam . Musculoskeletal:  no rigidity . Psychiatric: unable to assess . Neurologic: no involuntary movements          Lab Data and radiological Data:  Sodium 135 potassium is 3.5 BUN 13 creatinine 0.6 White count 6.1 hemoglobin 7.7 hematocrit 25.2 platelet count 306   Assessment/Plan  Patient Active Problem List   Diagnosis Date Noted  . Acute on chronic respiratory failure with hypoxia ([place])   . Acute encephalopathy   . Traumatic hemothorax   . Aspiration pneumonia due to gastric secretions ([place])       1. Acute on chronic respiratory failure with hypoxia we will continue with the with the weaning process and proceed to decannulation 2. Acute encephalopathy no change improved 3. Traumatic hemothorax resolved 4. Aspiration pneumonia treated   I have personally evaluated the patient, evaluated the laboratory and imaging results and formulated the assessment and plan and placed orders as needed. The Patient requires high complexity decision making with multiple system  involvement. Rounds were done with the Respiratory Therapy Director and respiratory therapist involved in the care of the patient as well as nursing staff.   Allyne Gee, MD [facility] Pulmonary Critical Care Medicine   This note is for inpatient care

## 2020-08-24 ENCOUNTER — Other Ambulatory Visit: Payer: Self-pay | Admitting: Internal Medicine

## 2020-08-24 NOTE — Progress Notes (Signed)
Decannulated

## 2020-08-28 DIAGNOSIS — Z433 Encounter for attention to colostomy: Secondary | ICD-10-CM | POA: Diagnosis not present

## 2020-08-28 DIAGNOSIS — E46 Unspecified protein-calorie malnutrition: Secondary | ICD-10-CM | POA: Diagnosis not present

## 2020-08-28 DIAGNOSIS — N390 Urinary tract infection, site not specified: Secondary | ICD-10-CM | POA: Diagnosis not present

## 2020-08-28 DIAGNOSIS — R1310 Dysphagia, unspecified: Secondary | ICD-10-CM | POA: Diagnosis not present

## 2020-08-28 DIAGNOSIS — L89154 Pressure ulcer of sacral region, stage 4: Secondary | ICD-10-CM | POA: Diagnosis not present

## 2020-08-28 DIAGNOSIS — I82412 Acute embolism and thrombosis of left femoral vein: Secondary | ICD-10-CM | POA: Diagnosis not present

## 2020-08-28 DIAGNOSIS — Z431 Encounter for attention to gastrostomy: Secondary | ICD-10-CM | POA: Diagnosis not present

## 2020-08-28 DIAGNOSIS — B9620 Unspecified Escherichia coli [E. coli] as the cause of diseases classified elsewhere: Secondary | ICD-10-CM | POA: Diagnosis not present

## 2020-08-28 DIAGNOSIS — Z430 Encounter for attention to tracheostomy: Secondary | ICD-10-CM | POA: Diagnosis not present

## 2020-08-28 DIAGNOSIS — I2699 Other pulmonary embolism without acute cor pulmonale: Secondary | ICD-10-CM | POA: Diagnosis not present

## 2020-08-28 DIAGNOSIS — E871 Hypo-osmolality and hyponatremia: Secondary | ICD-10-CM | POA: Diagnosis not present

## 2020-08-28 DIAGNOSIS — G4700 Insomnia, unspecified: Secondary | ICD-10-CM | POA: Diagnosis not present

## 2020-08-28 DIAGNOSIS — Z6841 Body Mass Index (BMI) 40.0 and over, adult: Secondary | ICD-10-CM | POA: Diagnosis not present

## 2020-08-28 DIAGNOSIS — G928 Other toxic encephalopathy: Secondary | ICD-10-CM | POA: Diagnosis not present

## 2020-08-28 DIAGNOSIS — G4733 Obstructive sleep apnea (adult) (pediatric): Secondary | ICD-10-CM | POA: Diagnosis not present

## 2020-08-28 DIAGNOSIS — J95821 Acute postprocedural respiratory failure: Secondary | ICD-10-CM | POA: Diagnosis not present

## 2020-08-28 DIAGNOSIS — D649 Anemia, unspecified: Secondary | ICD-10-CM | POA: Diagnosis not present

## 2020-08-28 DIAGNOSIS — U071 COVID-19: Secondary | ICD-10-CM | POA: Diagnosis not present

## 2020-09-17 DIAGNOSIS — R52 Pain, unspecified: Secondary | ICD-10-CM | POA: Diagnosis not present

## 2020-09-17 DIAGNOSIS — R531 Weakness: Secondary | ICD-10-CM | POA: Diagnosis not present

## 2020-09-17 DIAGNOSIS — M6250 Muscle wasting and atrophy, not elsewhere classified, unspecified site: Secondary | ICD-10-CM | POA: Diagnosis not present

## 2020-09-20 DIAGNOSIS — R52 Pain, unspecified: Secondary | ICD-10-CM | POA: Diagnosis not present

## 2020-09-20 DIAGNOSIS — R5381 Other malaise: Secondary | ICD-10-CM | POA: Diagnosis not present

## 2020-09-20 DIAGNOSIS — R0902 Hypoxemia: Secondary | ICD-10-CM | POA: Diagnosis not present

## 2020-10-03 ENCOUNTER — Emergency Department: Payer: Commercial Managed Care - HMO

## 2020-10-03 ENCOUNTER — Other Ambulatory Visit: Payer: Self-pay

## 2020-10-03 ENCOUNTER — Inpatient Hospital Stay
Admission: EM | Admit: 2020-10-03 | Discharge: 2020-10-07 | DRG: 539 | Disposition: A | Discharge status: Skilled Nursing Facility | Payer: Commercial Managed Care - HMO | Attending: Internal Medicine | Admitting: Internal Medicine

## 2020-10-03 DIAGNOSIS — F32A Depression, unspecified: Secondary | ICD-10-CM

## 2020-10-03 DIAGNOSIS — R531 Weakness: Secondary | ICD-10-CM | POA: Diagnosis not present

## 2020-10-03 DIAGNOSIS — Z9889 Other specified postprocedural states: Secondary | ICD-10-CM

## 2020-10-03 DIAGNOSIS — Z933 Colostomy status: Secondary | ICD-10-CM

## 2020-10-03 DIAGNOSIS — Z20822 Contact with and (suspected) exposure to covid-19: Secondary | ICD-10-CM | POA: Diagnosis present

## 2020-10-03 DIAGNOSIS — F419 Anxiety disorder, unspecified: Secondary | ICD-10-CM

## 2020-10-03 DIAGNOSIS — Z6841 Body Mass Index (BMI) 40.0 and over, adult: Secondary | ICD-10-CM

## 2020-10-03 DIAGNOSIS — L089 Local infection of the skin and subcutaneous tissue, unspecified: Secondary | ICD-10-CM

## 2020-10-03 DIAGNOSIS — A419 Sepsis, unspecified organism: Secondary | ICD-10-CM

## 2020-10-03 DIAGNOSIS — J9611 Chronic respiratory failure with hypoxia: Secondary | ICD-10-CM | POA: Diagnosis present

## 2020-10-03 DIAGNOSIS — R0902 Hypoxemia: Secondary | ICD-10-CM | POA: Diagnosis not present

## 2020-10-03 DIAGNOSIS — Z86718 Personal history of other venous thrombosis and embolism: Secondary | ICD-10-CM

## 2020-10-03 DIAGNOSIS — L8994 Pressure ulcer of unspecified site, stage 4: Secondary | ICD-10-CM

## 2020-10-03 DIAGNOSIS — Z91048 Other nonmedicinal substance allergy status: Secondary | ICD-10-CM

## 2020-10-03 DIAGNOSIS — F418 Other specified anxiety disorders: Secondary | ICD-10-CM | POA: Diagnosis present

## 2020-10-03 DIAGNOSIS — R079 Chest pain, unspecified: Secondary | ICD-10-CM

## 2020-10-03 DIAGNOSIS — E785 Hyperlipidemia, unspecified: Secondary | ICD-10-CM | POA: Diagnosis present

## 2020-10-03 DIAGNOSIS — R5381 Other malaise: Secondary | ICD-10-CM

## 2020-10-03 DIAGNOSIS — G629 Polyneuropathy, unspecified: Secondary | ICD-10-CM

## 2020-10-03 DIAGNOSIS — R29898 Other symptoms and signs involving the musculoskeletal system: Secondary | ICD-10-CM

## 2020-10-03 DIAGNOSIS — S31000A Unspecified open wound of lower back and pelvis without penetration into retroperitoneum, initial encounter: Secondary | ICD-10-CM

## 2020-10-03 DIAGNOSIS — R509 Fever, unspecified: Secondary | ICD-10-CM

## 2020-10-03 DIAGNOSIS — L89153 Pressure ulcer of sacral region, stage 3: Secondary | ICD-10-CM

## 2020-10-03 DIAGNOSIS — Z79899 Other long term (current) drug therapy: Secondary | ICD-10-CM

## 2020-10-03 DIAGNOSIS — E66813 Obesity, class 3: Secondary | ICD-10-CM

## 2020-10-03 DIAGNOSIS — M4628 Osteomyelitis of vertebra, sacral and sacrococcygeal region: Secondary | ICD-10-CM | POA: Diagnosis not present

## 2020-10-03 DIAGNOSIS — T148XXA Other injury of unspecified body region, initial encounter: Secondary | ICD-10-CM

## 2020-10-03 DIAGNOSIS — Z8616 Personal history of COVID-19: Secondary | ICD-10-CM

## 2020-10-03 DIAGNOSIS — L89154 Pressure ulcer of sacral region, stage 4: Secondary | ICD-10-CM | POA: Diagnosis present

## 2020-10-03 DIAGNOSIS — M8610 Other acute osteomyelitis, unspecified site: Secondary | ICD-10-CM

## 2020-10-03 DIAGNOSIS — Z7901 Long term (current) use of anticoagulants: Secondary | ICD-10-CM

## 2020-10-03 DIAGNOSIS — D649 Anemia, unspecified: Secondary | ICD-10-CM | POA: Diagnosis present

## 2020-10-03 DIAGNOSIS — Z87891 Personal history of nicotine dependence: Secondary | ICD-10-CM

## 2020-10-03 DIAGNOSIS — M8660 Other chronic osteomyelitis, unspecified site: Secondary | ICD-10-CM

## 2020-10-03 DIAGNOSIS — Z23 Encounter for immunization: Secondary | ICD-10-CM

## 2020-10-03 DIAGNOSIS — Z7401 Bed confinement status: Secondary | ICD-10-CM

## 2020-10-03 LAB — COMPREHENSIVE METABOLIC PANEL
ALT: 16 U/L (ref 0–44)
AST: 22 U/L (ref 15–41)
Albumin: 3.5 g/dL (ref 3.5–5.0)
Alkaline Phosphatase: 126 U/L (ref 38–126)
Anion gap: 11 (ref 5–15)
BUN: 10 mg/dL (ref 6–20)
CO2: 26 mmol/L (ref 22–32)
Calcium: 9.9 mg/dL (ref 8.9–10.3)
Chloride: 102 mmol/L (ref 98–111)
Creatinine, Ser: 0.72 mg/dL (ref 0.61–1.24)
GFR, Estimated: >60 mL/min (ref >60)
Glucose, Bld: 139 mg/dL — ABNORMAL HIGH (ref 70–99)
Potassium: 3.4 mmol/L — ABNORMAL LOW (ref 3.5–5.1)
Sodium: 139 mmol/L (ref 135–145)
Total Bilirubin: 0.4 mg/dL (ref 0.3–1.2)
Total Protein: 7.3 g/dL (ref 6.5–8.1)

## 2020-10-03 LAB — CBC WITH DIFFERENTIAL/PLATELET
Abs Immature Granulocytes: 0.05 10*3/uL (ref 0.00–0.07)
Basophils Absolute: 0.1 10*3/uL (ref 0.0–0.1)
Basophils Relative: 1 %
Eosinophils Absolute: 0.2 10*3/uL (ref 0.0–0.5)
Eosinophils Relative: 3 %
HCT: 34.5 % — ABNORMAL LOW (ref 39.0–52.0)
Hemoglobin: 10.7 g/dL — ABNORMAL LOW (ref 13.0–17.0)
Immature Granulocytes: 1 %
Lymphocytes Relative: 14 %
Lymphs Abs: 1 10*3/uL (ref 0.7–4.0)
MCH: 29.5 pg (ref 26.0–34.0)
MCHC: 31 g/dL (ref 30.0–36.0)
MCV: 95 fL (ref 80.0–100.0)
Monocytes Absolute: 0.6 10*3/uL (ref 0.1–1.0)
Monocytes Relative: 8 %
Neutro Abs: 5.6 10*3/uL (ref 1.7–7.7)
Neutrophils Relative %: 73 %
Platelets: 222 10*3/uL (ref 150–400)
RBC: 3.63 MIL/uL — ABNORMAL LOW (ref 4.22–5.81)
RDW: 14.9 % (ref 11.5–15.5)
WBC: 7.5 10*3/uL (ref 4.0–10.5)
nRBC: 0 % (ref 0.0–0.2)

## 2020-10-03 LAB — PROTIME-INR
INR: 1.4 — ABNORMAL HIGH (ref 0.8–1.2)
Prothrombin Time: 16.3 seconds — ABNORMAL HIGH (ref 11.4–15.2)

## 2020-10-03 LAB — TROPONIN I (HIGH SENSITIVITY): Troponin I (High Sensitivity): 5 ng/L (ref <18)

## 2020-10-03 LAB — LACTIC ACID, PLASMA: Lactic Acid, Venous: 2.7 mmol/L (ref 0.5–1.9)

## 2020-10-03 MED ORDER — VANCOMYCIN HCL IN DEXTROSE 1-5 GM/200ML-% IV SOLN
1000.0000 mg | Freq: Once | INTRAVENOUS | Status: AC
  Administered 2020-10-03: 1000 mg via INTRAVENOUS
  Filled 2020-10-03: qty 200

## 2020-10-03 MED ORDER — SODIUM CHLORIDE 0.9 % IV BOLUS
1000.0000 mL | Freq: Once | INTRAVENOUS | Status: AC
  Administered 2020-10-03: 1000 mL via INTRAVENOUS

## 2020-10-03 MED ORDER — MORPHINE SULFATE (PF) 4 MG/ML IV SOLN
4.0000 mg | Freq: Once | INTRAVENOUS | Status: AC
Start: 2020-10-03 — End: 2020-10-03
  Administered 2020-10-03: 4 mg via INTRAVENOUS
  Filled 2020-10-03: qty 1

## 2020-10-03 MED ORDER — IOHEXOL 350 MG/ML SOLN
100.0000 mL | Freq: Once | INTRAVENOUS | Status: AC | PRN
  Administered 2020-10-03: 100 mL via INTRAVENOUS

## 2020-10-03 MED ORDER — LACTATED RINGERS IV BOLUS: 1000.0000 mL | Freq: Once | INTRAVENOUS | Status: DC

## 2020-10-03 MED ORDER — LACTATED RINGERS IV BOLUS
1000.0000 mL | Freq: Once | INTRAVENOUS | Status: AC
  Administered 2020-10-03: 1000 mL via INTRAVENOUS

## 2020-10-03 NOTE — ED Provider Notes (Signed)
[facility] Emergency Department Provider Note   ____________________________________________   I have reviewed the triage vital signs and the nursing notes.   HISTORY  Chief Complaint Chest pain  History limited by: Not Limited   HPI Edward Soto is a 45 y.o. male who presents to the emergency department today because of concerns for chest pain.  He states that the chest pain started around 4 AM this morning.  The patient states he was awake when the pain started.  He states that he did not really sleep last night.  The patient says that he did have some associated shortness of breath.  Patient has a somewhat recent complex medical history including right-sided rib fractures, hemothorax with prolonged hospitalization and ICU stay.  Because of this patient is essentially bedbound at this time.  Patient denies any unusual activity recently.  He denies any fevers.   Records reviewed. Per medical record review patient has a history of DVT, traumatic hemothorax with prolonged hospitalization.   Past Medical History:  Diagnosis Date  . Acute encephalopathy   . Acute on chronic respiratory failure with hypoxia ([place])   . Aspiration pneumonia due to gastric secretions ([place])   . Depression   . DVT (deep venous thrombosis) ([facility])    right leg  . H/O seasonal allergies   . Traumatic hemothorax     Patient Active Problem List   Diagnosis Date Noted  . Acute on chronic respiratory failure with hypoxia ([place])   . Acute encephalopathy   . Traumatic hemothorax   . Aspiration pneumonia due to gastric secretions [facility])     Past Surgical History:  Procedure Laterality Date  . FRACTURE SURGERY Right   . KNEE ARTHROSCOPY Right   . 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 Authorizing Provider  cariprazine (VRAYLAR) capsule Take 3 mg by mouth daily.    [provider]  clonazePAM (KLONOPIN) 0.5 MG tablet Take 0.5 mg by mouth 2 (two) times daily as needed for anxiety.    [provider]    Allergies Patient has no known allergies.  History reviewed. No pertinent family history.  Social History Social History   Tobacco Use  . Smoking status: Former Smoker    Packs/day: 1.00    Years: 17.00    Pack years: 17.00    Types: Cigarettes    Quit date: 05/27/2020    Years since quitting: 0.3  . Smokeless tobacco: Former Network engineer  . Vaping Use: Never used  Substance Use Topics  . Alcohol use: Yes    Comment: occasional  . Drug use: No    Review of Systems Constitutional: No fever/chills Eyes: No visual changes. ENT: No sore throat. Cardiovascular: Positive for chest pain. Respiratory: Positive for shortness of breath. Gastrointestinal: No abdominal pain.  No nausea, no vomiting.  No diarrhea.   Genitourinary: Negative for dysuria. Musculoskeletal: Negative for back pain. Skin: Negative for rash. Neurological: Negative for headaches, focal weakness or numbness.  ____________________________________________   PHYSICAL EXAM:  VITAL SIGNS: ED Triage Vitals  Enc Vitals Group     BP 10/03/20 1820 122/83     Pulse Rate 10/03/20 1817 (!) 109     Resp 10/03/20 1820 15  Temp 10/03/20 1817 99.2 F (37.3 C)     Temp src --      SpO2 10/03/20 1817 96 %     Weight 10/03/20 1818 (!) 333 lb (151 kg)     Height 10/03/20 1818 6\' 2"  (1.88 m)     Head Circumference --      Peak Flow --      Pain Score 10/03/20 1818 3   Constitutional: Alert and oriented.  Eyes: Conjunctivae are normal.  ENT      Head: Normocephalic and atraumatic.      Nose: No congestion/rhinnorhea.      Mouth/Throat: Mucous membranes are moist.      Neck: No stridor. Hematological/Lymphatic/Immunilogical:  No cervical lymphadenopathy. Cardiovascular: Tachycardic, heart exam is limited secondary to body habitus.  Respiratory: Normal respiratory effort without tachypnea nor retractions. Breath sounds are clear and equal bilaterally. No wheezes/rales/rhonchi. Gastrointestinal: Soft and non tender. No rebound. No guarding.  Genitourinary: Deferred Musculoskeletal: Normal range of motion in all extremities. No lower extremity edema. Neurologic:  Normal speech and language. No gross focal neurologic deficits are appreciated.  Skin:  Large malodorous sacral wound.   Psychiatric: Mood and affect are normal. Speech and behavior are normal. Patient exhibits appropriate insight and judgment.  ____________________________________________    LABS (pertinent positives/negatives)  CBC wbc 7.5, hgb 10.7, plt 222 CMP wnl except k 3.4, glu 139 Lactic acid 2.7 ____________________________________________   EKG  I, Nance Pear, attending physician, personally viewed and interpreted this EKG  EKG Time: 1821 Rate: 110 Rhythm: sinus tachycardia Axis: normal Intervals: qtc 454 QRS: narrow, low voltage  ST changes: no st elevation Impression: abnormal ekg   ____________________________________________    RADIOLOGY  CXR Mild pulmonary vascular congestion   ____________________________________________   PROCEDURES  Procedures  ____________________________________________   INITIAL IMPRESSION / ASSESSMENT AND PLAN / ED COURSE  Pertinent labs & imaging results that were available during my care of the patient were reviewed by me and considered in my medical decision making (see chart for details).   Patient presented to the emergency department today with primary concern for chest pain.  About the time my exam his chest pain had resolved.  EMS did document an elevated temperature for the patient.  While patient was afebrile here he was found to have an elevated lactic acid level.   Patient has a large sacral wound which is a known issue.  Wife states that it actually has been decreasing in size.  On my exam it is quite large and malodorous.  CT scan is concerning for possible osteo-.  Because of this and the fact that the patient has an elevated lactic do think it requires treatment.  Will start on IV vancomycin.  I additionally did get a CT angio of the patient's chest given chest pain and immobility.  This did not show any PE.  Discussed findings with the patient and patient's family.  ____________________________________________   FINAL CLINICAL IMPRESSION(S) / ED DIAGNOSES  Final diagnoses:  Sepsis ([place])     Note: This dictation was prepared with Dragon dictation. Any transcriptional errors that result from this process are unintentional     Nance Pear, MD 10/03/20 2306

## 2020-10-03 NOTE — ED Triage Notes (Signed)
Pt arrives to ER via [facility] from home for CP. CP has resolved at this time. Initially was diaphoretic. C/o leg pains. Has a sacral wound, has home health come out. Axillary temp 100.1. Pt was tachy with EMS. Pt arrives A&O. Arrives 20 to L FA with IVF infusing.

## 2020-10-03 NOTE — ED Notes (Addendum)
Wound care and new wet-to-dry dressing with abd pad placed to pt's sacral decubitus ulcer. Pt's colostomy bag removed and replaced with new bag.

## 2020-10-04 DIAGNOSIS — R0902 Hypoxemia: Secondary | ICD-10-CM | POA: Diagnosis not present

## 2020-10-04 DIAGNOSIS — T148XXA Other injury of unspecified body region, initial encounter: Secondary | ICD-10-CM | POA: Diagnosis not present

## 2020-10-04 DIAGNOSIS — L89153 Pressure ulcer of sacral region, stage 3: Secondary | ICD-10-CM | POA: Diagnosis not present

## 2020-10-04 DIAGNOSIS — E785 Hyperlipidemia, unspecified: Secondary | ICD-10-CM | POA: Diagnosis present

## 2020-10-04 DIAGNOSIS — Z79899 Other long term (current) drug therapy: Secondary | ICD-10-CM | POA: Diagnosis not present

## 2020-10-04 DIAGNOSIS — M8610 Other acute osteomyelitis, unspecified site: Secondary | ICD-10-CM

## 2020-10-04 DIAGNOSIS — Z86718 Personal history of other venous thrombosis and embolism: Secondary | ICD-10-CM

## 2020-10-04 DIAGNOSIS — A419 Sepsis, unspecified organism: Secondary | ICD-10-CM | POA: Diagnosis not present

## 2020-10-04 DIAGNOSIS — J9611 Chronic respiratory failure with hypoxia: Secondary | ICD-10-CM | POA: Diagnosis present

## 2020-10-04 DIAGNOSIS — Z7401 Bed confinement status: Secondary | ICD-10-CM | POA: Diagnosis not present

## 2020-10-04 DIAGNOSIS — G629 Polyneuropathy, unspecified: Secondary | ICD-10-CM

## 2020-10-04 DIAGNOSIS — R29898 Other symptoms and signs involving the musculoskeletal system: Secondary | ICD-10-CM

## 2020-10-04 DIAGNOSIS — L089 Local infection of the skin and subcutaneous tissue, unspecified: Secondary | ICD-10-CM

## 2020-10-04 DIAGNOSIS — Z8616 Personal history of COVID-19: Secondary | ICD-10-CM | POA: Diagnosis not present

## 2020-10-04 DIAGNOSIS — Z933 Colostomy status: Secondary | ICD-10-CM

## 2020-10-04 DIAGNOSIS — Z7901 Long term (current) use of anticoagulants: Secondary | ICD-10-CM | POA: Diagnosis not present

## 2020-10-04 DIAGNOSIS — D6489 Other specified anemias: Secondary | ICD-10-CM | POA: Diagnosis not present

## 2020-10-04 DIAGNOSIS — Z91048 Other nonmedicinal substance allergy status: Secondary | ICD-10-CM | POA: Diagnosis not present

## 2020-10-04 DIAGNOSIS — R509 Fever, unspecified: Secondary | ICD-10-CM | POA: Diagnosis not present

## 2020-10-04 DIAGNOSIS — R079 Chest pain, unspecified: Secondary | ICD-10-CM

## 2020-10-04 DIAGNOSIS — Z20822 Contact with and (suspected) exposure to covid-19: Secondary | ICD-10-CM | POA: Diagnosis present

## 2020-10-04 DIAGNOSIS — M4628 Osteomyelitis of vertebra, sacral and sacrococcygeal region: Principal | ICD-10-CM

## 2020-10-04 DIAGNOSIS — M8660 Other chronic osteomyelitis, unspecified site: Secondary | ICD-10-CM | POA: Diagnosis not present

## 2020-10-04 DIAGNOSIS — M2550 Pain in unspecified joint: Secondary | ICD-10-CM | POA: Diagnosis not present

## 2020-10-04 DIAGNOSIS — F418 Other specified anxiety disorders: Secondary | ICD-10-CM | POA: Diagnosis present

## 2020-10-04 DIAGNOSIS — D649 Anemia, unspecified: Secondary | ICD-10-CM | POA: Diagnosis present

## 2020-10-04 DIAGNOSIS — R5381 Other malaise: Secondary | ICD-10-CM

## 2020-10-04 DIAGNOSIS — Z23 Encounter for immunization: Secondary | ICD-10-CM | POA: Diagnosis not present

## 2020-10-04 DIAGNOSIS — Z6841 Body Mass Index (BMI) 40.0 and over, adult: Secondary | ICD-10-CM | POA: Diagnosis not present

## 2020-10-04 DIAGNOSIS — S31000A Unspecified open wound of lower back and pelvis without penetration into retroperitoneum, initial encounter: Secondary | ICD-10-CM | POA: Diagnosis present

## 2020-10-04 DIAGNOSIS — L89154 Pressure ulcer of sacral region, stage 4: Secondary | ICD-10-CM | POA: Diagnosis present

## 2020-10-04 DIAGNOSIS — G9782 Other postprocedural complications and disorders of nervous system: Secondary | ICD-10-CM | POA: Diagnosis not present

## 2020-10-04 DIAGNOSIS — Z9889 Other specified postprocedural states: Secondary | ICD-10-CM

## 2020-10-04 DIAGNOSIS — F419 Anxiety disorder, unspecified: Secondary | ICD-10-CM

## 2020-10-04 DIAGNOSIS — F32A Depression, unspecified: Secondary | ICD-10-CM

## 2020-10-04 DIAGNOSIS — Z87891 Personal history of nicotine dependence: Secondary | ICD-10-CM | POA: Diagnosis not present

## 2020-10-04 LAB — SARS CORONAVIRUS 2 BY RT PCR (HOSPITAL ORDER, PERFORMED IN ~~LOC~~ HOSPITAL LAB): SARS Coronavirus 2: NEGATIVE

## 2020-10-04 LAB — CBC
HCT: 32.6 % — ABNORMAL LOW (ref 39.0–52.0)
Hemoglobin: 10.3 g/dL — ABNORMAL LOW (ref 13.0–17.0)
MCH: 29.9 pg (ref 26.0–34.0)
MCHC: 31.6 g/dL (ref 30.0–36.0)
MCV: 94.5 fL (ref 80.0–100.0)
Platelets: 214 10*3/uL (ref 150–400)
RBC: 3.45 MIL/uL — ABNORMAL LOW (ref 4.22–5.81)
RDW: 15 % (ref 11.5–15.5)
WBC: 6.9 10*3/uL (ref 4.0–10.5)
nRBC: 0 % (ref 0.0–0.2)

## 2020-10-04 LAB — BASIC METABOLIC PANEL
Anion gap: 11 (ref 5–15)
BUN: 10 mg/dL (ref 6–20)
CO2: 26 mmol/L (ref 22–32)
Calcium: 9.9 mg/dL (ref 8.9–10.3)
Chloride: 102 mmol/L (ref 98–111)
Creatinine, Ser: 0.62 mg/dL (ref 0.61–1.24)
GFR, Estimated: >60 mL/min (ref >60)
Glucose, Bld: 97 mg/dL (ref 70–99)
Potassium: 3.6 mmol/L (ref 3.5–5.1)
Sodium: 139 mmol/L (ref 135–145)

## 2020-10-04 LAB — URINALYSIS, COMPLETE (UACMP) WITH MICROSCOPIC
Bacteria, UA: NONE SEEN
Bilirubin Urine: NEGATIVE
Glucose, UA: NEGATIVE mg/dL
Hgb urine dipstick: NEGATIVE
Ketones, ur: NEGATIVE mg/dL
Leukocytes,Ua: NEGATIVE
Nitrite: NEGATIVE
Protein, ur: NEGATIVE mg/dL
Specific Gravity, Urine: 1.035 — ABNORMAL HIGH (ref 1.005–1.030)
Squamous Epithelial / HPF: NONE SEEN (ref 0–5)
pH: 5 (ref 5.0–8.0)

## 2020-10-04 LAB — PROTIME-INR
INR: 1.4 — ABNORMAL HIGH (ref 0.8–1.2)
Prothrombin Time: 16.2 seconds — ABNORMAL HIGH (ref 11.4–15.2)

## 2020-10-04 LAB — PROCALCITONIN: Procalcitonin: <0.1 ng/mL

## 2020-10-04 LAB — LACTIC ACID, PLASMA: Lactic Acid, Venous: 1.6 mmol/L (ref 0.5–1.9)

## 2020-10-04 LAB — HIV ANTIBODY (ROUTINE TESTING W REFLEX): HIV Screen 4th Generation wRfx: NONREACTIVE

## 2020-10-04 LAB — CORTISOL-AM, BLOOD: Cortisol - AM: 8.4 ug/dL (ref 6.7–22.6)

## 2020-10-04 MED ORDER — CARIPRAZINE HCL 3 MG PO CAPS
4.5000 mg | ORAL_CAPSULE | Freq: Every day | ORAL | Status: DC
  Administered 2020-10-05 – 2020-10-07 (×3): 4.5 mg via ORAL
  Filled 2020-10-04 (×3): qty 1

## 2020-10-04 MED ORDER — VANCOMYCIN HCL IN DEXTROSE 1-5 GM/200ML-% IV SOLN
1000.0000 mg | Freq: Two times a day (BID) | INTRAVENOUS | Status: DC
  Filled 2020-10-04 (×2): qty 200

## 2020-10-04 MED ORDER — GABAPENTIN 600 MG PO TABS
600.0000 mg | ORAL_TABLET | Freq: Four times a day (QID) | ORAL | Status: DC
  Administered 2020-10-04 – 2020-10-07 (×13): 600 mg via ORAL
  Filled 2020-10-04 (×13): qty 1

## 2020-10-04 MED ORDER — SODIUM CHLORIDE 0.9 % IV SOLN: 2.0000 g | INTRAVENOUS | Status: DC

## 2020-10-04 MED ORDER — MOMETASONE FURO-FORMOTEROL FUM 100-5 MCG/ACT IN AERO
2.0000 | INHALATION_SPRAY | Freq: Two times a day (BID) | RESPIRATORY_TRACT | Status: DC
  Administered 2020-10-04 – 2020-10-07 (×6): 2 via RESPIRATORY_TRACT
  Filled 2020-10-04: qty 8.8

## 2020-10-04 MED ORDER — HYDROCODONE-ACETAMINOPHEN 5-325 MG PO TABS
1.0000 | ORAL_TABLET | ORAL | Status: DC | PRN
  Administered 2020-10-04 – 2020-10-05 (×6): 2 via ORAL
  Administered 2020-10-06: 1 via ORAL
  Administered 2020-10-06 – 2020-10-07 (×3): 2 via ORAL
  Filled 2020-10-04 (×12): qty 2

## 2020-10-04 MED ORDER — PANTOPRAZOLE SODIUM 40 MG PO TBEC
40.0000 mg | DELAYED_RELEASE_TABLET | Freq: Every day | ORAL | Status: DC
  Administered 2020-10-04 – 2020-10-07 (×4): 40 mg via ORAL
  Filled 2020-10-04 (×4): qty 1

## 2020-10-04 MED ORDER — INFLUENZA VAC SPLIT QUAD 0.5 ML IM SUSY
0.5000 mL | PREFILLED_SYRINGE | INTRAMUSCULAR | Status: AC
  Administered 2020-10-06: 0.5 mL via INTRAMUSCULAR
  Filled 2020-10-04: qty 0.5

## 2020-10-04 MED ORDER — ONDANSETRON HCL 4 MG/2ML IJ SOLN: 4.0000 mg | Freq: Four times a day (QID) | INTRAMUSCULAR | Status: DC | PRN

## 2020-10-04 MED ORDER — ENOXAPARIN SODIUM 150 MG/ML ~~LOC~~ SOLN
150.0000 mg | Freq: Two times a day (BID) | SUBCUTANEOUS | Status: DC
  Administered 2020-10-04: 150 mg via SUBCUTANEOUS
  Filled 2020-10-04 (×2): qty 1

## 2020-10-04 MED ORDER — ACETAMINOPHEN 325 MG PO TABS: 650.0000 mg | ORAL_TABLET | Freq: Four times a day (QID) | ORAL | Status: DC | PRN

## 2020-10-04 MED ORDER — TRAZODONE HCL 50 MG PO TABS
50.0000 mg | ORAL_TABLET | Freq: Every day | ORAL | Status: DC
  Administered 2020-10-04 – 2020-10-06 (×3): 50 mg via ORAL
  Filled 2020-10-04 (×3): qty 1

## 2020-10-04 MED ORDER — ENOXAPARIN SODIUM 300 MG/3ML IJ SOLN
1.0000 mg/kg | Freq: Two times a day (BID) | INTRAMUSCULAR | Status: DC
  Administered 2020-10-04 – 2020-10-07 (×6): 155 mg via SUBCUTANEOUS
  Filled 2020-10-04 (×9): qty 1.55

## 2020-10-04 MED ORDER — CLONAZEPAM 0.5 MG PO TABS
0.5000 mg | ORAL_TABLET | Freq: Two times a day (BID) | ORAL | Status: DC | PRN
Start: 2020-10-04 — End: 2020-10-08

## 2020-10-04 MED ORDER — ENOXAPARIN SODIUM 150 MG/ML ~~LOC~~ SOLN
150.0000 mg | Freq: Two times a day (BID) | SUBCUTANEOUS | Status: DC
  Filled 2020-10-04 (×2): qty 1

## 2020-10-04 MED ORDER — CARIPRAZINE HCL 3 MG PO CAPS
3.0000 mg | ORAL_CAPSULE | Freq: Every day | ORAL | Status: DC
Start: 2020-10-04 — End: 2020-10-04
  Administered 2020-10-04: 3 mg via ORAL
  Filled 2020-10-04: qty 1

## 2020-10-04 MED ORDER — KETOROLAC TROMETHAMINE 30 MG/ML IJ SOLN
30.0000 mg | Freq: Four times a day (QID) | INTRAMUSCULAR | Status: DC | PRN
  Administered 2020-10-06 – 2020-10-07 (×2): 30 mg via INTRAVENOUS
  Filled 2020-10-04 (×3): qty 1

## 2020-10-04 MED ORDER — ACETAMINOPHEN 650 MG RE SUPP: 650.0000 mg | Freq: Four times a day (QID) | RECTAL | Status: DC | PRN

## 2020-10-04 MED ORDER — SODIUM CHLORIDE 0.9 % IV SOLN: INTRAVENOUS | Status: DC

## 2020-10-04 MED ORDER — ONDANSETRON HCL 4 MG PO TABS: 4.0000 mg | ORAL_TABLET | Freq: Four times a day (QID) | ORAL | Status: DC | PRN

## 2020-10-04 MED ORDER — VANCOMYCIN HCL 1500 MG/300ML IV SOLN
1500.0000 mg | Freq: Once | INTRAVENOUS | Status: AC
  Administered 2020-10-04: 1500 mg via INTRAVENOUS
  Filled 2020-10-04: qty 300

## 2020-10-04 MED ORDER — FUROSEMIDE 40 MG PO TABS
40.0000 mg | ORAL_TABLET | Freq: Every day | ORAL | Status: DC
  Administered 2020-10-04 – 2020-10-06 (×3): 40 mg via ORAL
  Filled 2020-10-04 (×3): qty 1

## 2020-10-04 MED ORDER — SODIUM CHLORIDE 0.9 % IV SOLN
2.0000 g | INTRAVENOUS | Status: DC
  Administered 2020-10-04 – 2020-10-05 (×2): 2 g via INTRAVENOUS
  Filled 2020-10-04 (×2): qty 20
  Filled 2020-10-04: qty 2

## 2020-10-04 MED ORDER — ATORVASTATIN CALCIUM 10 MG PO TABS
10.0000 mg | ORAL_TABLET | Freq: Every day | ORAL | Status: DC
  Administered 2020-10-04 – 2020-10-07 (×4): 10 mg via ORAL
  Filled 2020-10-04 (×4): qty 1

## 2020-10-04 MED ORDER — VANCOMYCIN HCL 1250 MG/250ML IV SOLN
1250.0000 mg | Freq: Two times a day (BID) | INTRAVENOUS | Status: DC
  Filled 2020-10-04 (×2): qty 250

## 2020-10-04 MED ORDER — VANCOMYCIN HCL 1250 MG/250ML IV SOLN
1250.0000 mg | Freq: Three times a day (TID) | INTRAVENOUS | Status: DC
  Administered 2020-10-04 – 2020-10-05 (×4): 1250 mg via INTRAVENOUS
  Filled 2020-10-04 (×7): qty 250

## 2020-10-04 MED ORDER — VENLAFAXINE HCL ER 75 MG PO CP24
150.0000 mg | ORAL_CAPSULE | Freq: Every day | ORAL | Status: DC
  Administered 2020-10-05 – 2020-10-07 (×3): 150 mg via ORAL
  Filled 2020-10-04 (×3): qty 2

## 2020-10-04 NOTE — Consult Note (Signed)
Subjective:   CC: sacral wound  HPI:  Edward Soto is a 45 y.o. male who was consulted by [facility] for evaluation of above.  First noted several months ago, after prolonged hospitalization.  He has been having issues with home health and home PT due to insurance issues recently, and wife has been performing wet to dry dressing changes recently.  Still complains of significant pain in the area, states it is not an acute exacerbation, but been around for some time.     Past Medical History:  has a past medical history of Acute encephalopathy, Acute on chronic respiratory failure with hypoxia (HCC), Aspiration pneumonia due to gastric secretions ([place]), Depression, DVT (deep venous thrombosis) ([place]), H/O seasonal allergies, and Traumatic hemothorax.  Past Surgical History:  has a past surgical history that includes Knee arthroscopy (Right); Fracture surgery (Right); and Mass excision (Right, 09/24/2013).  Family History: family history is not on file.  Social History:  reports that he quit smoking about 4 months ago. His smoking use included cigarettes. He has a 17.00 pack-year smoking history. He has quit using smokeless tobacco. He reports current alcohol use. He reports that he does not use drugs.  Current Medications:  Prior to Admission medications   Medication Sig Start Date End Date Taking? Authorizing Provider  atorvastatin (LIPITOR) 10 MG tablet Take 10 mg by mouth daily.   Yes [provider]  furosemide (LASIX) 40 MG tablet Take 40 mg by mouth.   Yes [provider]  gabapentin (NEURONTIN) 600 MG tablet Take 600 mg by mouth 4 (four) times daily.   Yes [provider]  oxyCODONE (OXY IR/ROXICODONE) 5 MG immediate release tablet Take 5 mg by mouth every 6 (six) hours as needed. 09/17/20  Yes [provider]  pantoprazole (PROTONIX) 40 MG tablet Take 40 mg by mouth daily.   Yes [provider]  traZODone (DESYREL) 50 MG tablet Take 50 mg by mouth  at bedtime.   Yes [provider]  venlafaxine XR (EFFEXOR-XR) 150 MG 24 hr capsule Take 150 mg by mouth daily with breakfast.   Yes [provider]  VRAYLAR 4.5 MG CAPS Take 4.5 mg by mouth daily. 05/13/20  Yes [provider]  warfarin (COUMADIN) 6 MG tablet Take 6 mg by mouth daily.   Yes [provider]  clonazePAM (KLONOPIN) 0.5 MG tablet Take 0.5 mg by mouth 2 (two) times daily as needed for anxiety. Patient not taking: No sig reported    [provider]    Allergies:  No Known Allergies  ROS:  General: Denies weight loss, weight gain, fatigue, fevers, chills, and night sweats. Eyes: Denies blurry vision, double vision, eye pain, itchy eyes, and tearing. Ears: Denies hearing loss, earache, and ringing in ears. Nose: Denies sinus pain, congestion, infections, runny nose, and nosebleeds. Mouth/throat: Denies hoarseness, sore throat, bleeding gums, and difficulty swallowing. Heart: Denies chest pain, palpitations, racing heart, irregular heartbeat, leg pain or swelling, and decreased activity tolerance. Respiratory: Denies breathing difficulty, shortness of breath, wheezing, cough, and sputum. GI: Denies change in appetite, heartburn, nausea, vomiting, constipation, diarrhea, and blood in stool. GU: Denies difficulty urinating, pain with urinating, urgency, frequency, blood in urine. Musculoskeletal: Denies joint stiffness, pain, swelling, muscle weakness. Skin: Denies rash, itching, mass, tumors, sores, and boils Neurologic: Denies headache, fainting, dizziness, seizures, numbness, and tingling. Psychiatric: Denies depression, anxiety, difficulty sleeping, and memory loss. Endocrine: Denies heat or cold intolerance, and increased thirst or urination. Blood/lymph: Denies easy bruising,  easy bruising, and swollen glands     Objective:     BP 119/78 (BP Location: Right Arm)   Pulse 93   Temp 98.5 F (36.9 C) (Axillary)   Resp 18   Ht  6\' 2"  (1.88 m)   Wt (!) 156.4 kg   SpO2 92%   BMI 44.27 kg/m   Constitutional :  alert, cooperative, appears stated age and no distress  Lymphatics/Throat:  no asymmetry, masses, or scars  Respiratory:  clear to auscultation bilaterally  Cardiovascular:  regular rate and rhythm  Gastrointestinal: soft, non-tender; bowel sounds normal; no masses,  no organomegaly.  Musculoskeletal: Steady gait and movement  Skin: Cool and moist, sacral ulcer, stage III.  No obvious drainage or tunneling noted.  Please see pic below.    Psychiatric: Normal affect, non-agitated, not confused           LABS:  CMP Latest Ref Rng & Units 10/04/2020 10/03/2020 08/17/2019  Glucose 70 - 99 mg/dL 97 139(H) 142(H)  BUN 6 - 20 mg/dL 10 10 16   Creatinine 0.61 - 1.24 mg/dL 0.62 0.72 1.20  Sodium 135 - 145 mmol/L 139 139 140  Potassium 3.5 - 5.1 mmol/L 3.6 3.4(L) 3.7  Chloride 98 - 111 mmol/L 102 102 102  CO2 22 - 32 mmol/L 26 26 27   Calcium 8.9 - 10.3 mg/dL 9.9 9.9 9.0  Total Protein 6.5 - 8.1 g/dL - 7.3 7.3  Total Bilirubin 0.3 - 1.2 mg/dL - 0.4 0.6  Alkaline Phos 38 - 126 U/L - 126 79  AST 15 - 41 U/L - 22 26  ALT 0 - 44 U/L - 16 37   CBC Latest Ref Rng & Units 10/04/2020 10/03/2020 08/17/2019  WBC 4.0 - 10.5 K/uL 6.9 7.5 8.6  Hemoglobin 13.0 - 17.0 g/dL 10.3(L) 10.7(L) 15.2  Hematocrit 39.0 - 52.0 % 32.6(L) 34.5(L) 46.3  Platelets 150 - 400 K/uL 214 222 182    RADS: CLINICAL DATA:  Sacral wound.  Hip pain.  EXAM: CT PELVIS WITHOUT CONTRAST  TECHNIQUE: Multidetector CT imaging of the pelvis was performed following the standard protocol without intravenous contrast.  COMPARISON:  CT dated 05/26/2017  FINDINGS: Urinary Tract:  The urinary bladder is filled with contrast.  Bowel:  Unremarkable visualized pelvic bowel loops.  Vascular/Lymphatic: No pathologically enlarged lymph nodes. No significant vascular abnormality seen.  Reproductive:  No mass or other significant  abnormality  Other:  None.  Musculoskeletal: There is extensive presacral soft tissue edema and fat stranding. There is a large soft tissue defect overlying the coccyx and sacrum. There are destructive changes of the coccyx and lower sacrum. There is a partially visualized mass in the right gluteus maximus muscle measuring approximately 6.3 cm (axial series 3, image 73).  IMPRESSION: 1. Sacral decubitus ulcer with findings concerning for osteomyelitis involving the coccyx and lower sacrum. 2. Partially visualized, indeterminate mass involving the right gluteus maximus muscle. This could represent a hematoma or soft tissue mass. Correlation with the patient's history is recommended. This can be further evaluated by ultrasound as clinically indicated.   Electronically Signed   By: Constance Holster M.D.   On: 10/03/2020 22:52  Impression Performed by [facility] RAD MRI of the pelvis without and with IV contrast-  1. 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  2. Large sacral decubitus ulcer involving the deep soft tissues extending to the level of the bone with undermining along the fascial plane between the right gluteus maximus muscles and subcutaneous tissues.  No evidence of abscess.  3. Intramuscular nonenhancing fluid collections within the gluteus medius and gluteus maximus muscles on the right with areas of T1 hyperintensity, consistent with hematomas.   Findings were discussed with Andi Devon M.D. by Bruna Potter M.D. by phone 4:05 PM on 08/13/2020.  Narrative Performed by [facility] RAD This result has an attachment that is not available.  EXAM: MRI PELVIS W WO CONTRAST MSK  DATE: 08/13/2020 3:11 PM  ACCESSION: [id number]  DICTATED: 08/13/2020 3:32 PM  INTERPRETATION LOCATION: [place]   CLINICAL INDICATION: 45 years old Male with Osteomyelitis vs infectious with sacral wound    COMPARISON CT abdomen pelvis 07/23/2020.: None.   TECHNIQUE: MRI of  the pelvis and both hips was performed before and after administration of IV contrast using the body multicoil array. Multisequence, multiplanar large field of view images were obtained. IV contrast: 10 mL MultiHance   FINDINGS:   Suboptimal inhomogenous fat saturation limits assessment.   Bones: Bone marrow edema and enhancement within the second and third coccygeal segments with corresponding T1 hypointensity (sagittal image 31), consistent with osteomyelitis.   No additional areas of bone marrow edema or abnormal enhancement. No fractures. Heterogeneous marrow in the proximal femurs.   Lower Lumbar Spine: Unremarkable.   Sacroiliac Joints: No effusion. No sacroiliitis.   Right Hip: Normal alignment. No asymmetric effusion. No high-grade chondral defect.   Left Hip: Normal alignment. No asymmetric effusion. High grade chondral defect.   Symphysis Pubis: Unremarkable.   Musculature: Generalized intramuscular edema and fatty atrophy throughout the visualized paraspinal, pelvic, and lower extreme musculature. Nonenhancing intramuscular fluid collections within the radius maximus and gluteus medius muscles (axial image 33) with areas of T1 hyperintensity, consistent withhematomas. The fluid collection in the gluteus medius muscle measures approximately 8.0 x 4.8 cm (AP by transverse. The gluteus maximus hematoma is ill-defined but measures approximately 5.2 x 5.5 cm (AP by transverse).   Tendons: Tendons are grossly intact.   Pelvic Cavity: Diffuse bladder wall thickening may be secondary to underdistention. Foley catheter in place. No free fluid in the pelvis. No pathologically enlarged pelvic or groin lymph nodes. Bilateral small fat-containing inguinal hernias.   Other: Large sacral decubitus ulcer extending through the subcutaneous tissues and deep soft tissues to the level of the underlying coccyx distal sacrum. No evidence of abscess. Undermining of the soft tissues extends from  the ulcer along the fascial plane between the subcutaneous tissues and   Gluteus maximus muscle. The ulceration extends to the level of the coccyx and distal sacrum. Undermining is noted along the fascial plane between the subcutaneous tissues and right gluteal musculature (series8, image 30). No subcutaneous fluid collection.  Assessment:      Sacral ulcer, stage III Right gluteus hematoma- chronic  Plan:     Wound actually looks like it is healing, no bone visible. Evidence of osteo in the past, but not sure if current CT findings indicative of ongoing infection based on how wound looks.  Still has pain along the right hip area where there is a documented hematoma on most recent CT as well as from MRI at [facility]. Low probability of  Infection, but if it is going to inhibit his mobility secondary to pain, ortho consult needed since it is within the musculature.  Recommend wound care consult for additional recs on maximizing healing. Wet to dry with meplix covering for now.  Pressure bed needed. Further care per primary team.  Surgery to sign off.

## 2020-10-04 NOTE — Progress Notes (Signed)
ANTICOAGULATION CONSULT NOTE - Initial Consult  Pharmacy Consult for Lovenox  Indication: DVT  No Known Allergies  Patient Measurements: Height: 6\' 2"  (188 cm) Weight: (!) 151 kg (333 lb) IBW/kg (Calculated) : 82.2 Heparin Dosing Weight:   Vital Signs: Temp: 99.2 F (37.3 C) (02/06 1817) BP: 126/77 (02/07 0130) Pulse Rate: 94 (02/07 0130)  Labs: Recent Labs    10/03/20 1823  HGB 10.7*  HCT 34.5*  PLT 222  LABPROT 16.3*  INR 1.4*  CREATININE 0.72  TROPONINIHS 5    Estimated Creatinine Clearance: 180.9 mL/min (by C-G formula based on SCr of 0.72 mg/dL).   Medical History: Past Medical History:  Diagnosis Date  . Acute encephalopathy   . Acute on chronic respiratory failure with hypoxia ([place])   . Aspiration pneumonia due to gastric secretions ([place])   . Depression   . DVT (deep venous thrombosis) (HCC)    right leg  . H/O seasonal allergies   . Traumatic hemothorax     Medications:  (Not in a hospital admission)   Assessment: Pharmacy consulted to dose lovenox in this 45 year old male admitted with cellulitis, DVT.    CrCl = 180.9 ml/min  TBS = 151 kg  Pt was on Eliquis PTA but has not had a dose since 2/5 PM.   Goal of Therapy:  resolution of DVT  Monitor platelets by anticoagulation protocol: Yes   Plan:  Lovenox 150 mg SQ Q12H ordered to start on 2/7 @ 0200.  Will monitor CBC/platelets daily.   Arlisa Leclere D 10/04/2020,2:03 AM

## 2020-10-04 NOTE — TOC Initial Note (Addendum)
Transition of Care [facility]) - Initial/Assessment Note    Patient Details  Name: Edward Soto MRN: [id number] Date of Birth: 1976-08-26  Transition of Care [facility]) CM/SW Contact:    Candie Chroman, LCSW Phone Number:  10/04/2020, 12:31 PM  Clinical Narrative:   CSW met with patient. No supports at bedside. CSW introduced role and explained that PT recommendations would be discussed. Patient agreeable to SNF placement "if we can find somewhere to take him." Provided CMS scores for facilities within 25 miles of his zip code. No further concerns. CSW encouraged patient to contact CSW as needed. CSW will continue to follow patient for support and facilitate discharge to SNF once medically stable.               3:34 pm: [employer] [phone]) to see if patient has SNF benefits. They emailed CSW a list of facilities they are in network with.  4:15 pm: [employer] customer service representative stated that our hospital is not in network with his plan and he has no out-of-network benefits. Admitting and UR are aware. CSW went by room and made patient aware.  Expected Discharge Plan: Skilled Nursing Facility Barriers to Discharge: Insurance Authorization,Continued Medical Work up   Patient Goals and CMS Choice   CMS Medicare.gov Compare Post Acute Care list provided to:: Patient    Expected Discharge Plan and Services Expected Discharge Plan: [place] Choice: [place] arrangements for the past 2 months: Apartment                                      Prior Living Arrangements/Services Living arrangements for the past 2 months: Apartment Lives with:: Spouse (Children) Patient language and need for interpreter reviewed:: Yes Do you feel safe going back to the place where you live?: Yes      Need for Family Participation in Patient Care: Yes (Comment) Care giver support system in place?: Yes (comment) Current  home services: DME Criminal Activity/Legal Involvement Pertinent to Current Situation/Hospitalization: No - Comment as needed  Activities of Daily Living Home Assistive Devices/Equipment: Hospital bed ADL Screening (condition at time of admission) Patient's cognitive ability adequate to safely complete daily activities?: Yes Is the patient deaf or have difficulty hearing?: No Does the patient have difficulty seeing, even when wearing glasses/contacts?: No Does the patient have difficulty concentrating, remembering, or making decisions?: No Patient able to express need for assistance with ADLs?: Yes Does the patient have difficulty dressing or bathing?: Yes Independently performs ADLs?: No Communication: Independent Dressing (OT): Needs assistance Is this a change from baseline?: Pre-admission baseline Grooming: Needs assistance Is this a change from baseline?: Pre-admission baseline Feeding: Needs assistance Is this a change from baseline?: Pre-admission baseline Bathing: Needs assistance Is this a change from baseline?: Pre-admission baseline Toileting: Needs assistance Is this a change from baseline?: Pre-admission baseline In/Out Bed: Dependent Is this a change from baseline?: Pre-admission baseline Walks in Home: Dependent Is this a change from baseline?: Pre-admission baseline Does the patient have difficulty walking or climbing stairs?: Yes Weakness of Legs: Both Weakness of Arms/Hands: Both  Permission Sought/Granted Permission sought to share information with : Facility Art therapist granted to share information with : Yes, Verbal Permission Granted     Permission granted to share info w AGENCY: SNF's        Emotional Assessment  Appearance:: Appears stated age Attitude/Demeanor/Rapport: Engaged,Gracious Affect (typically observed): Accepting,Appropriate,Calm Orientation: : Oriented to Self,Oriented to Place,Oriented to  Time,Oriented to  Situation Alcohol / Substance Use: Not Applicable Psych Involvement: No (comment)  Admission diagnosis:  Wound of sacral region, initial encounter [S31.000A] Sepsis ([place]) [A41.9] Sepsis, due to unspecified organism, unspecified whether acute organ dysfunction present [facility]) [A41.9] Patient Active Problem List   Diagnosis Date Noted  . Sepsis ([place]) 10/04/2020  . 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 History of tracheostomy 10/04/2020  . Obesity, Class III, BMI 40-49.9 (morbid obesity) ([place]) 10/04/2020  . Decubitus ulcer of sacral region, stage 4 ([place]) 10/04/2020  . Acute on chronic osteomyelitis ([place]) 10/04/2020  . Infected sacral decubitus ulcer, stage IV ([place]) 10/04/2020  . Physical deconditioning 10/04/2020  . Lower extremity weakness 10/04/2020  . History of DVT (deep vein thrombosis) 10/04/2020  . Chronic anticoagulation 10/04/2020  . Chest pain 10/04/2020  . Bedbound 10/04/2020  . Acute on chronic respiratory failure with hypoxia ([place])   . Acute encephalopathy   . Traumatic hemothorax   . Aspiration pneumonia due to gastric secretions [facility])    PCP:  Deanne Coffer, MD Pharmacy:   [facility] [address]), [place] [address] [place]) [place] [address] Phone: [phone] Fax: [phone]  [id number] - [place], [place] - [address] [place] [address] Phone: [phone] Fax: [phone]     Social Determinants of Health (SDOH) Interventions    Readmission Risk Interventions No flowsheet data found.

## 2020-10-04 NOTE — Evaluation (Signed)
Physical Therapy Evaluation Patient Details Name: Edward Soto MRN: [id number] DOB: 01-23-1976 Today's Date: 10/04/2020   History of Present Illness  Pt is a 45yo M admitted to [facility] on 10/03/20 for c/o chest pain and SOB. Pt tachycardic, febrile, and had elevated lactic acid levels upon admission. Pt with stage IV sacral decubitus ulcer that was previously being addressed with HH. 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 Pt was DC from LTAC and was home less than 2 weeks before coming to ED. Imaging remarkable for sacral decubitus ulcer with findings concerning for osteomyelitis involving the coccyx, lower sacrum, and R glute max.    Clinical Impression  Pt is a 45 year old M admitted to hospital on 10/03/20 for c/o chest pain and SOB. Pt with previous hospitalization from Sept-Dec 2021 and was DC to [facility] for 2 mo. Pt at home for 10 days prior to ED admission. At baseline, pt was Ind with using urinal and feeding himself; otherwise pt required assistance for full body dressing and sponge bathing. Pt able to perform seated balance at EOB for 15-43m at [facility] with assistance from rehab staff, but has not performed transfers/gait since hospitalization. Pt presents with BLE weakness R>L, BLE resting in hip flex/ER and knee flex, stage 4 sacral ulcer concerning for osteomyelitis, altered sensation of RLE, increased pain, anxiety with mobility, and decreased activity tolerance, resulting in impaired functional mobility. Due to deficits, pt required max assist +2 for supine scooting in bed for repositioning. Pt currently declining further participation with therapy today due to increased pain and anxiety. However, pt able to  tolerate PROM of RLE with increased pain level from 7-8/10; RLE adjusted to neutral positioning for pain management and comfort. Deficits limit the pt's ability to safely and independently perform ADL's, transfer, and ambulate. Pt will benefit from acute skilled PT services to address deficits and appropriate positioning, for return to baseline function. At this time, PT recommends SNF rehab to address deficits prior to return home. Pt agreeable.     Follow Up Recommendations SNF    Equipment Recommendations  Other (comment) (Will continue to assess)    Recommendations for Other Services       Precautions / Restrictions Precautions Precautions: Fall Restrictions Weight Bearing Restrictions: No Other Position/Activity Restrictions: significant RLE (knee) pain      Mobility  Bed Mobility Overal bed mobility: Needs Assistance             General bed mobility comments: Max assist +2 for supine posterior/lateral scooting for repositioning in bed. Pt able to pull through BUE on handrail with cues. Unable to assist with BLE due to pain/weakness.    Transfers                 General transfer comment: deferred due to pain/anxiety  Ambulation/Gait                   Balance Overall balance assessment: Needs assistance     Sitting balance - Comments: Unable to assess due to pt declining EOB mobility d/t pain and weakness; will likely need assistance due to pt PLOF  Pertinent Vitals/Pain Pain Assessment: 0-10 Pain Score: 7  Pain Location: RLE (knee) - 7/10 beginning of session, 8/10 at end of session Pain Descriptors / Indicators: Constant;Sharp;Shooting Pain Intervention(s): Limited activity within patient's tolerance;Patient requesting pain meds-RN notified;Monitored during session;Repositioned;Utilized relaxation techniques    Home Living Family/patient expects to be discharged to:: Private residence Living  Arrangements: Spouse/significant other;Children Available Help at Discharge: Family;Available 24 hours/day Type of Home: Apartment (Duplex) Home Access: Stairs to enter Entrance Stairs-Rails: None Entrance Stairs-Number of Steps: 5 steps from curb, then 2 STE home Home Layout: One level Home Equipment: Hospital bed Additional Comments: hospital bed with trapeze bar    Prior Function Level of Independence: Needs assistance   Gait / Transfers Assistance Needed: Pt Ind prior to previous hospitalization; since DC home from LTAC, pt unable to transfer/ambulation. Was able to sit EOB with rehab staff at [facility] for 15-19m  ADL's / Homemaking Assistance Needed: Ind with using urinal and feeding; otherwise needed assistance with sponge bathing  Comments: Has not performed OOB mobility since LTAC     Hand Dominance        Extremity/Trunk Assessment   Upper Extremity Assessment Upper Extremity Assessment: Defer to OT evaluation    Lower Extremity Assessment Lower Extremity Assessment: Generalized weakness;RLE deficits/detail;LLE deficits/detail RLE Deficits / Details: Grossly 1/5 strength with exception of 0/5 DF/PF; decreased light touch sensation L2-L4 with c/o allodynia, absent deep/light touch bil feet with pt denying neuropathy. PROM limited secondary to pain RLE: Unable to fully assess due to pain RLE Sensation: decreased light touch RLE Coordination: decreased gross motor;decreased fine motor LLE Deficits / Details: Grossly 2/5; sensation intact, AROM limited secondary to weakness LLE Sensation: WNL       Communication   Communication: No difficulties  Cognition Arousal/Alertness: Awake/alert Behavior During Therapy: Anxious Overall Cognitive Status: Within Functional Limits for tasks assessed                                 General Comments: Pt alert during session, but presented with mild agitation and anxiety with talk of mobility. Pt required encouragement  for participation.      General Comments General comments (skin integrity, edema, etc.): Pt BLE resting in hip flex/ER and knee flex; RLE repositioned in neutral position for pain management    Exercises Other Exercises Other Exercises: Pt able to participate in supine bed mobility (posterior and lateral scooting) for respositioning, requiring max assist +2 and cues for sequencing; pt able to assist with BUE, but unable to utilize BLE due to pain/weakness. Other Exercises: Pt RLE repositioned in neutral position for pain management after PROM performed. Increased pain behaviors noted with RLE mobility; PROM/AROM limited due to pain. Other Exercises: Pt educated regarding: PT role/POC, DC recommendations, pain management techniques (RLE repositioning, PLB, supine positioning), benefits of participation with therapy. He verbalized understanding.   Assessment/Plan    PT Assessment Patient needs continued PT services  PT Problem List Decreased strength;Decreased mobility;Decreased range of motion;Decreased coordination;Obesity;Decreased activity tolerance;Decreased skin integrity;Impaired sensation;Pain       PT Treatment Interventions Gait training;Functional mobility training;Therapeutic activities;Therapeutic exercise;DME instruction;Balance training;Neuromuscular re-education;Patient/family education;Wheelchair mobility training    PT Goals (Current goals can be found in the Care Plan section)  Acute Rehab PT Goals Patient Stated Goal: Be active with grandchildren PT Goal Formulation: With patient Time For Goal Achievement: 10/18/20 Potential to Achieve Goals: Fair    Frequency Min 2X/week   Barriers to  discharge Inaccessible home environment Multiple STE home without railing, no DME at home, pt reports difficulty obtaining [employer] services    Co-evaluation PT/OT/SLP Co-Evaluation/Treatment: Yes Reason for Co-Treatment: Complexity of the patient's impairments (multi-system  involvement);For patient/therapist safety;To address functional/ADL transfers PT goals addressed during session: Mobility/safety with mobility;Strengthening/ROM OT goals addressed during session: ADL's and self-care;Strengthening/ROM       AM-PAC PT "6 Clicks" Mobility  Outcome Measure Help needed turning from your back to your side while in a flat bed without using bedrails?: Total Help needed moving from lying on your back to sitting on the side of a flat bed without using bedrails?: Total Help needed moving to and from a bed to a chair (including a wheelchair)?: Total Help needed standing up from a chair using your arms (e.g., wheelchair or bedside chair)?: Total Help needed to walk in hospital room?: Total Help needed climbing 3-5 steps with a railing? : Total 6 Click Score: 6    End of Session   Activity Tolerance: Patient limited by pain Patient left: in bed;with call bell/phone within reach;with bed alarm set Nurse Communication: Mobility status;Patient requests pain meds PT Visit Diagnosis: Muscle weakness (generalized) (M62.81);Difficulty in walking, not elsewhere classified (R26.2);Other symptoms and signs involving the nervous system (R29.898);Hemiplegia and hemiparesis;Pain Hemiplegia - Right/Left: Right Hemiplegia - caused by: Unspecified Pain - Right/Left: Right Pain - part of body: Knee    Time: 4628-6381 PT Time Calculation (min) (ACUTE ONLY): 26 min   Charges:   PT Evaluation $PT Eval Moderate Complexity: 1 Mod        Herminio Commons, PT, DPT 9:43 AM,10/04/20

## 2020-10-04 NOTE — Evaluation (Signed)
Occupational Therapy Evaluation Patient Details Name: Edward Soto MRN: [id number] DOB: 1975/09/11 Today's Date: 10/04/2020    History of Present Illness Pt is a 45yo M admitted to [facility] on 10/03/20 for c/o chest pain and SOB. Pt tachycardic, febrile, and had elevated lactic acid levels upon admission. Pt with stage IV sacral decubitus ulcer that was previously being addressed with HH. 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 Pt was DC from LTAC and was home less than 2 weeks before coming to ED. Imaging remarkable for sacral decubitus ulcer with findings concerning for osteomyelitis involving the coccyx, lower sacrum, and R glute max.   Clinical Impression   Pt was seen for OT evaluation and co-tx with PT this date. Prior to hospital admission, pt was only home for approx 10 days after recent [facility] stay before that. Pt lives in 1 story home with 5+2 steps to enter, no ramp. Pt was essentially unable to get out of bed and required significant assist for bathing, dressing, and pericare. Pt reports that at [facility] the only thing they worked on with him was sitting EOB. Pt endorses being very motivated and is eager to be able to play with his 7yo and 2yo grandchildren. Currently pt demonstrates impairments as described below (See OT problem list) which functionally limit his ability to perform ADL/self-care tasks. Pt endorsing having had no pain medication for R knee prior to session and endorses feeling a bit anxious to move without pain meds. EOB deferred 2/2 pain. Pt currently requires MAX A +2 for bed mobility to scoot up in bed. Pt would benefit from skilled OT services to address noted impairments and functional  limitations (see below for any additional details) in order to maximize safety and independence while minimizing falls risk and caregiver burden. Upon hospital discharge, recommend STR to maximize pt safety and return to PLOF.     Follow Up Recommendations  SNF    Equipment Recommendations  Other (comment) (bariatric BSC)    Recommendations for Other Services       Precautions / Restrictions Precautions Precautions: Fall Restrictions Weight Bearing Restrictions: No Other Position/Activity Restrictions: significant RLE (knee) pain      Mobility Bed Mobility Overal bed mobility: Needs Assistance             General bed mobility comments: Max assist +2 for supine posterior/lateral scooting for repositioning in bed. Pt able to pull through BUE on handrail with cues. Unable to assist with BLE due to pain/weakness.    Transfers                 General transfer comment: deferred due to pain/anxiety    Balance Overall balance assessment: Needs assistance     Sitting balance - Comments: Unable to assess due to pt declining EOB mobility d/t pain and weakness; will likely need assistance due to pt PLOF                                   ADL either performed or assessed with clinical judgement   ADL Overall ADL's : Needs assistance/impaired  General ADL Comments: MAX A for bed level bathing, dressing, and toileting tasks, set up for grooming and self feeding in long sitting in bed     Vision Patient Visual Report: No change from baseline       Perception     Praxis      Pertinent Vitals/Pain Pain Assessment: 0-10 Pain Score: 7  Pain Location: RLE (knee) - 7/10 beginning of session, 8/10 at end of session Pain Descriptors / Indicators: Constant;Sharp;Shooting Pain Intervention(s): Limited activity within patient's tolerance;Monitored during session;Repositioned;Patient requesting pain meds-RN  notified;Utilized relaxation techniques     Hand Dominance     Extremity/Trunk Assessment Upper Extremity Assessment Upper Extremity Assessment: Generalized weakness   Lower Extremity Assessment Lower Extremity Assessment: Generalized weakness;RLE deficits/detail;LLE deficits/detail;Defer to PT evaluation RLE Deficits / Details: Grossly 1/5 strength with exception of 0/5 DF/PF; decreased light touch sensation L2-L4 with c/o allodynia, absent deep/light touch bil feet with pt denying neuropathy. PROM limited secondary to pain RLE: Unable to fully assess due to pain RLE Sensation: decreased light touch RLE Coordination: decreased gross motor;decreased fine motor LLE Deficits / Details: Grossly 2/5; sensation intact, AROM limited secondary to weakness LLE Sensation: WNL       Communication Communication Communication: No difficulties   Cognition Arousal/Alertness: Awake/alert Behavior During Therapy: Anxious Overall Cognitive Status: Within Functional Limits for tasks assessed                                 General Comments: Pt alert during session, but presented with mild agitation and anxiety with talk of mobility. Pt required encouragement for participation.   General Comments  Pt BLE resting in hip flex/ER and knee flex; RLE repositioned in neutral position for pain management    Exercises Other Exercises Other Exercises: Pt able to participate in supine bed mobility (posterior and lateral scooting) for respositioning, requiring max assist +2 and cues for sequencing; pt able to assist with BUE, but unable to utilize BLE due to pain/weakness. Other Exercises: Pt RLE repositioned in neutral position for pain management after PROM performed. Increased pain behaviors noted with RLE mobility; PROM/AROM limited due to pain. Other Exercises: Pt educated regarding: PT role/POC, DC recommendations, pain management techniques (RLE repositioning, PLB, supine positioning),  benefits of participation with therapy. He verbalized understanding.   Shoulder Instructions      Home Living Family/patient expects to be discharged to:: Private residence Living Arrangements: Spouse/significant other;Children Available Help at Discharge: Family;Available 24 hours/day Type of Home: Apartment (Duplex) Home Access: Stairs to enter Entrance Stairs-Number of Steps: 5 steps from curb, then 2 STE home Entrance Stairs-Rails: None Home Layout: One level     Bathroom Shower/Tub: Teacher, early years/pre: Standard     Home Equipment: Hospital bed   Additional Comments: hospital bed with trapeze bar      Prior Functioning/Environment Level of Independence: Needs assistance  Gait / Transfers Assistance Needed: Pt Ind prior to previous hospitalization; since DC home from LTAC, pt unable to transfer/ambulation. Was able to sit EOB with rehab staff at [facility] for 15-29m ADL's / Homemaking Assistance Needed: Ind with using urinal and feeding; otherwise needed assistance with sponge bathing UB/LB and UB/LB dressing, pericare   Comments: Has not performed OOB mobility since LTAC        OT Problem List: Decreased strength;Pain;Decreased range of motion;Impaired sensation;Decreased activity tolerance;Impaired balance (sitting and/or standing);Decreased knowledge of use of DME or AE;Obesity  OT Treatment/Interventions: Self-care/ADL training;Therapeutic exercise;Therapeutic activities;DME and/or AE instruction;Patient/family education;Balance training    OT Goals(Current goals can be found in the care plan section) Acute Rehab OT Goals Patient Stated Goal: Be active with grandchildren OT Goal Formulation: With patient Time For Goal Achievement: 10/18/20 Potential to Achieve Goals: Good ADL Goals Pt Will Perform Grooming: with min guard assist;sitting Pt Will Perform Upper Body Bathing: with min assist;sitting Additional ADL Goal #1: Pt will perform bed mobility  requiring MAX A x1 to perform in anticipation of ADL and to support optimal positioning in bed  OT Frequency: Min 1X/week   Barriers to D/C: Inaccessible home environment          Co-evaluation PT/OT/SLP Co-Evaluation/Treatment: Yes Reason for Co-Treatment: Complexity of the patient's impairments (multi-system involvement);For patient/therapist safety;To address functional/ADL transfers PT goals addressed during session: Mobility/safety with mobility;Strengthening/ROM OT goals addressed during session: ADL's and self-care;Strengthening/ROM      AM-PAC OT "6 Clicks" Daily Activity     Outcome Measure Help from another person eating meals?: None Help from another person taking care of personal grooming?: A Little Help from another person toileting, which includes using toliet, bedpan, or urinal?: Total Help from another person bathing (including washing, rinsing, drying)?: A Lot Help from another person to put on and taking off regular upper body clothing?: A Lot Help from another person to put on and taking off regular lower body clothing?: A Lot 6 Click Score: 14   End of Session    Activity Tolerance: Patient limited by pain Patient left: in bed;with call bell/phone within reach;with bed alarm set  OT Visit Diagnosis: Other abnormalities of gait and mobility (R26.89);Muscle weakness (generalized) (M62.81);Pain Pain - Right/Left: Right Pain - part of body: Knee                Time: 7858-8502 OT Time Calculation (min): 30 min Charges:  OT General Charges $OT Visit: 1 Visit OT Evaluation $OT Eval Moderate Complexity: 1 Mod  Jeni Salles, MPH, MS, OTR/L ascom [phone] 10/04/20, 1:04 PM

## 2020-10-04 NOTE — Progress Notes (Signed)
Patient ID: Edward Soto, male   DOB: 1976-03-06, 45 y.o.   MRN: [id number] [facility] PROGRESS NOTE  CLAYTON BOSSERMAN RDE:[id number] DOB: May 12, 1976 DOA: 10/03/2020 PCP: Deanne Coffer, MD  HPI/Subjective: Patient admitted this morning with fever and drainage from his sacral wound.  Patient also had some chest pain.  Patient admitted with clinical sepsis with acute on chronic osteomyelitis.  Patient did not sleep much last night.  Objective: Vitals:   10/04/20 0923 10/04/20 1222  BP:  102/84  Pulse: 86 92  Resp: 16 18  Temp:  98.2 F (36.8 C)  SpO2:  96%    Intake/Output Summary (Last 24 hours) at 10/04/2020 1523 Last data filed at 10/04/2020 1414 Gross per 24 hour  Intake 1775.84 ml  Output 800 ml  Net 975.84 ml   Filed Weights   10/03/20 1818 10/04/20 0538  Weight: (!) 151 kg (!) 156.4 kg    ROS: Review of Systems  Constitutional: Positive for malaise/fatigue.  Respiratory: Negative for shortness of breath.   Cardiovascular: Positive for chest pain.  Gastrointestinal: Negative for abdominal pain, nausea and vomiting.   Exam: Physical Exam HENT:     Head: Normocephalic.     Mouth/Throat:     Pharynx: No oropharyngeal exudate.  Eyes:     General: Lids are normal.     Conjunctiva/sclera: Conjunctivae normal.  Cardiovascular:     Rate and Rhythm: Normal rate and regular rhythm.     Heart sounds: Normal heart sounds, S1 normal and S2 normal.  Pulmonary:     Breath sounds: Examination of the right-lower field reveals decreased breath sounds. Examination of the left-lower field reveals decreased breath sounds. Decreased breath sounds present. No wheezing, rhonchi or rales.  Abdominal:     Palpations: Abdomen is soft.     Tenderness: There is no abdominal tenderness.  Musculoskeletal:     Right ankle: Swelling present.     Left ankle: Swelling present.  Skin:    General: Skin is warm.     Findings: No rash.  Neurological:     Mental Status: He is alert and  oriented to person, place, and time.         Data Reviewed: Basic Metabolic Panel: Recent Labs  Lab 10/03/20 1823 10/04/20 0500  NA 139 139  K 3.4* 3.6  CL 102 102  CO2 26 26  GLUCOSE 139* 97  BUN 10 10  CREATININE 0.72 0.62  CALCIUM 9.9 9.9   Liver Function Tests: Recent Labs  Lab 10/03/20 1823  AST 22  ALT 16  ALKPHOS 126  BILITOT 0.4  PROT 7.3  ALBUMIN 3.5   CBC: Recent Labs  Lab 10/03/20 1823 10/04/20 0500  WBC 7.5 6.9  NEUTROABS 5.6  --   HGB 10.7* 10.3*  HCT 34.5* 32.6*  MCV 95.0 94.5  PLT 222 214     Recent Results (from the past 240 hour(s))  Blood culture (routine x 2)     Status: None (Preliminary result)   Collection Time: 10/03/20 11:30 PM   Specimen: BLOOD  Result Value Ref Range Status   Specimen Description BLOOD LEFT ANTECUBITAL  Final   Special Requests   Final    BOTTLES DRAWN AEROBIC AND ANAEROBIC Blood Culture results may not be optimal due to an inadequate volume of blood received in culture bottles   Culture   Final    NO GROWTH < 12 HOURS Performed at [facility], [address], [place], [place] [address]  Report Status PENDING  Incomplete  Blood culture (routine x 2)     Status: None (Preliminary result)   Collection Time: 10/03/20 11:30 PM   Specimen: BLOOD  Result Value Ref Range Status   Specimen Description BLOOD RIGHT ANTECUBITAL  Final   Special Requests   Final    BOTTLES DRAWN AEROBIC AND ANAEROBIC Blood Culture results may not be optimal due to an inadequate volume of blood received in culture bottles   Culture   Final    NO GROWTH < 12 HOURS Performed at [facility], [address], [place], [place] [address]    Report Status PENDING  Incomplete  SARS Coronavirus 2 by RT PCR (hospital order, performed in [place] hospital lab) Nasopharyngeal Nasopharyngeal Swab     Status: None   Collection Time: 10/03/20 11:30 PM   Specimen: Nasopharyngeal Swab  Result Value Ref Range Status    SARS Coronavirus 2 NEGATIVE NEGATIVE Final    Comment: (NOTE) SARS-CoV-2 target nucleic acids are NOT DETECTED.  The SARS-CoV-2 RNA is generally detectable in upper and lower respiratory specimens during the acute phase of infection. The lowest concentration of SARS-CoV-2 viral copies this assay can detect is 250 copies / mL. A negative result does not preclude SARS-CoV-2 infection and should not be used as the sole basis for treatment or other patient management decisions.  A negative result may occur with improper specimen collection / handling, submission of specimen other than nasopharyngeal swab, presence of viral mutation(s) within the areas targeted by this assay, and inadequate number of viral copies (<250 copies / mL). A negative result must be combined with clinical observations, patient history, and epidemiological information.  Fact Sheet for Patients:   StrictlyIdeas.no  Fact Sheet for Healthcare Providers: BankingDealers.co.za  This test is not yet approved or  cleared by the [place] FDA and has been authorized for detection and/or diagnosis of SARS-CoV-2 by FDA under an Emergency Use Authorization (EUA).  This EUA will remain in effect (meaning this test can be used) for the duration of the COVID-19 declaration under Section 564(b)(1) of the Act, 21 U.S.C. section 360bbb-3(b)(1), unless the authorization is terminated or revoked sooner.  Performed at [facility], [address] [place], [place] [address]      Studies: CT Angio Chest PE W and/or Wo Contrast  Result Date: 10/03/2020 CLINICAL DATA:  Chest pain, diaphoresis, lower extremity pain EXAM: CT ANGIOGRAPHY CHEST WITH CONTRAST TECHNIQUE: Multidetector CT imaging of the chest was performed using the standard protocol during bolus administration of intravenous contrast. Multiplanar CT image reconstructions and MIPs were obtained to evaluate the  vascular anatomy. CONTRAST:  162mL OMNIPAQUE IOHEXOL 350 MG/ML SOLN COMPARISON:  10/03/2020 FINDINGS: Cardiovascular: This is a technically adequate evaluation of the pulmonary vasculature. No filling defects or pulmonary emboli. The heart is unremarkable without pericardial effusion. There is a small pericardial cyst along the left heart border, measuring 1.8 cm in size. No evidence of thoracic aortic aneurysm or dissection. Mediastinum/Nodes: Multiple hypodense nodules are seen within the thyroid, largest in the lower pole left lobe measuring up to 2.1 cm in maximal dimension. 10 mm nonspecific right paratracheal lymph node is identified. No other pathologic adenopathy. Trachea and esophagus are unremarkable. Lungs/Pleura: No acute airspace disease, effusion, or pneumothorax. Subsegmental atelectasis or scarring within the lingula. Dependent atelectasis within the bilateral lower lobes. Central airways are patent. Upper Abdomen: Percutaneous gastrostomy tube within the gastric lumen. No acute upper abdominal process. Musculoskeletal: Chronic posttraumatic and postsurgical  changes within the right thoracic cage. There are numerous bilateral healed rib fractures. No acute bony abnormalities. Reconstructed images demonstrate no additional findings. Review of the MIP images confirms the above findings. IMPRESSION: 1. No evidence of pulmonary embolus. 2. No acute intrathoracic process. 3. Multiple hypodense thyroid nodules, largest measuring 2.1 cm. Recommend nonemergent thyroid US. (Ref: J Am Coll Radiol. 2015 Feb;12(2): 143-50). Electronically Signed   By: Randa Ngo M.D.   On: 10/03/2020 21:25   CT PELVIS WO CONTRAST  Result Date: 10/03/2020 CLINICAL DATA:  Sacral wound.  Hip pain. EXAM: CT PELVIS WITHOUT CONTRAST TECHNIQUE: Multidetector CT imaging of the pelvis was performed following the standard protocol without intravenous contrast. COMPARISON:  CT dated 05/26/2017 FINDINGS: Urinary Tract:  The urinary  bladder is filled with contrast. Bowel:  Unremarkable visualized pelvic bowel loops. Vascular/Lymphatic: No pathologically enlarged lymph nodes. No significant vascular abnormality seen. Reproductive:  No mass or other significant abnormality Other:  None. Musculoskeletal: There is extensive presacral soft tissue edema and fat stranding. There is a large soft tissue defect overlying the coccyx and sacrum. There are destructive changes of the coccyx and lower sacrum. There is a partially visualized mass in the right gluteus maximus muscle measuring approximately 6.3 cm (axial series 3, image 73). IMPRESSION: 1. Sacral decubitus ulcer with findings concerning for osteomyelitis involving the coccyx and lower sacrum. 2. Partially visualized, indeterminate mass involving the right gluteus maximus muscle. This could represent a hematoma or soft tissue mass. Correlation with the patient's history is recommended. This can be further evaluated by ultrasound as clinically indicated. Electronically Signed   By: Constance Holster M.D.   On: 10/03/2020 22:52   DG Chest Port 1 View  Result Date: 10/03/2020 CLINICAL DATA:  Sepsis EXAM: PORTABLE CHEST 1 VIEW COMPARISON:  July 28, 2018 FINDINGS: The heart size and mediastinal contours are within normal limits. There is prominence of the central pulmonary vasculature. The visualized skeletal structures are unremarkable. IMPRESSION: Mild pulmonary vascular congestion Electronically Signed   By: Prudencio Pair M.D.   On: 10/03/2020 19:20    Scheduled Meds: . atorvastatin  10 mg Oral Daily  . [START ON 10/05/2020] cariprazine  4.5 mg Oral Daily  . enoxaparin (LOVENOX) injection  1 mg/kg Subcutaneous Q12H  . furosemide  40 mg Oral q1800  . gabapentin  600 mg Oral QID  . [START ON 10/05/2020] influenza vac split quadrivalent PF  0.5 mL Intramuscular Tomorrow-1000  . pantoprazole  40 mg Oral Daily  . traZODone  50 mg Oral QHS  . [START ON 10/05/2020] venlafaxine XR  150 mg Oral  Q breakfast   Continuous Infusions: . cefTRIAXone (ROCEPHIN)  IV 200 mL/hr at 10/04/20 [id number]  . vancomycin 1,250 mg (10/04/20 1413)    Assessment/Plan:  1. Clinical sepsis, present on admission with stage IV infected sacral decubiti and acute on chronic osteomyelitis.  Patient also had tachycardia and fever and lactic acidosis.  I ordered a wound culture.  Admitting physician placed on antibiotics vancomycin and Rocephin.  I asked Dr. Lysle Pearl general surgery to evaluate and also infectious disease specialist to evaluate.  Coumadin on hold and patient on Lovenox just in case debridement needed. 2. Atypical chest pain.  CT scan negative for pulmonary embolism and cardiac enzymes negative.  Could be secondary to sepsis. 3. Recent Covid-19+ on 09/03/2020.  Treated with 3 days of remdesivir 4. Morbid obesity with a BMI of 44.27 5. Hyperlipidemia unspecified on atorvastatin 6. Peripheral neuropathy on gabapentin 7. Anxiety depression continue  psychiatric medications and trazodone for sleep  Pressure Injury 10/03/20 Sacrum Right (Active)  10/03/20 2130  Location: Sacrum  Location Orientation: Right  Staging:   Wound Description (Comments):   Present on Admission: Yes       Code Status:     Code Status Orders  (From admission, onward)         Start     Ordered   10/04/20 0025  Full code  Continuous        10/04/20 0028        Code Status History    This patient has a current code status but no historical code status.   Advance Care Planning Activity     Family Communication: Spoke with wife on the phone Disposition Plan: Status is: Inpatient  Dispo: The patient is from: Home              Anticipated d/c is to: Rehab              Anticipated d/c date is: Likely will need 3 or 4 days here in the hospital              Patient currently receiving IV antibiotics for acute on chronic osteomyelitis   Difficult to place patient.  Hopefully not.  Time spent: 31 minutes.  Case  discussed with ID and general surgery.  [place]  [facility] [employer]

## 2020-10-04 NOTE — Consult Note (Signed)
NAME: Edward Soto  DOB: 04-28-1976  MRN: [id number]  Date/Time: 10/04/2020 11:48 AM  REQUESTING PROVIDER: Leslye Peer Subjective:  REASON FOR CONSULT: Sacral decubitus ulcer ? Edward Soto is a 45 y.o. male who has had a complicated medical history since September 2021 is admitted from home with retrosternal sharp chest pain of a few hours duration. Patient has complicated medical history since September.  He was admitted to [facility] between 05/15/2020 until 08/14/2020 presented to [facility] with rt flank bruise precipitated by cough. On CT found to have rt 9th rib fracture and hemothorax with extensive chest wall hematoma. 05/16/20: rt VATS, 9th rib plating and hemothorax evacuation His operation complicated by hypoxia and PO course complicated by FUO, toxic metabolic encephalopathy , prolonged mechanical ventilation, large stage IV sacral decubitus ID saw him for fever, serotonin syndrome, NMS were questioned, DVT 3/4 extremities was also thought to contribute to fever. UA, bC, ECHO, respiratory culture, CT scan were non diagnostic 11/18 tracheal culture acinetobacter, then aspiration pneumonia was questioned and he got 7 days of meropenem. He was given multiple courses of antibiotic  Prior Abx: (9/20- 9/21) linezolid; (9/21- 9/23 ) flagyl; (9/22- 9/23) vanc; (9/20- 9/24) cefepime; (9/24-9/27) ceftriaxone; (9/30-10/1), (10/1-10/5) - Zosyn, d/c with c/f drug fever; ( 10/5 - 10/7) Ceftriaxone; (10/11 - 10/15) Meropenem; (10/12 -10/13) Metronidazole; (10/15 - 10/20) Unasyn; (11/3 - 11/15) Linezolid; (11/15 - 11/16) Vancomycin; (11/4 -11/19) Micafungin; (11/06 - 11/19) Meropenem; (11/19 - 11/21) Aztreonam, (11/20 - 11/21) Metronidazole; (11/21 - 11/23) tobramycin; (11/19 - 11/24) Fluconazole; (11/18 - 11/24) Vancomycin; (11/21-11/27) Meropenem   Hospital course was complicated by BLE/RUE weakness, neurology recommended EMG/NCS as OP to r/o neuropathy, myopathy Rhabdomyolysis due to immobilization for surgery  with CK  as high as 25K on POD4  Sacral wound multiple debridements ( 11/7 was the last one) diverting loop colostomy on 11/9, wound vac No antibiotics were given specifically for the osteo of secpnd and third coccyx.  DVT on eliquis  Nutrition s/p peg placement on 10/22 and replaced on 07/01/21 discharged to [facility] on 08/14/21. . [facility]fever on 09/03/20 and tested positive for covid. Given remdisivir. Rt upper quadrant pain - 09/03/20 CT done showed focal area of fat stranding D.D prior omental infarct VS post traumatic /inflammatory process- seen by surgeon and managed conservatively with NSAID  Discharged home on 09/17/20. Went to [facility] ED on 09/20/20 for wound care issues. Was not given any antibiotic because of chronicity of the wound and plan to refer to surgery as OP. Pt presented with EMS to the hospital on 10/03/20 with chest pain. Pt was found to be febrile at 102.1, tachycadria  In the ED temp 99.2, HR 93, BP 123/76, sats 93% WBC 7.5, HB 10.7, plt 222 and cr 0.72 Blood culture sent and he was put on vanco and ceftriaxone I am asked to see him for the wound  Pt says he weighed 450 pounds before he was sick and used to work as a Freight forwarder in a ware house  Currently unble to walk- is at home- and his daughter is taking care of him- His wife works No fever No chills     Past Medical History:  Diagnosis Date  . Acute encephalopathy   . Acute on chronic respiratory failure with hypoxia ([place])   . Aspiration pneumonia due to gastric secretions ([place])   . Depression   . DVT (deep venous thrombosis) ([facility])    right leg  . H/O seasonal allergies   .  Traumatic hemothorax     Past Surgical History:  Procedure Laterality Date  . FRACTURE SURGERY Right   . KNEE ARTHROSCOPY Right   . MASS EXCISION Right 09/24/2013   Procedure: EXCISION MASS;  Surgeon: Newt Minion, MD;  Location: [facility];  Service: Orthopedics;  Laterality: Right;  Right Foot Excision  Dorsal and Plantar Hemangioma    Social History   Socioeconomic History  . Marital status: Married    Spouse name: Not on file  . Number of children: Not on file  . Years of education: Not on file  . Highest education level: Not on file  Occupational History  . Not on file  Tobacco Use  . Smoking status: Former Smoker    Packs/day: 1.00    Years: 17.00    Pack years: 17.00    Types: Cigarettes    Quit date: 05/27/2020    Years since quitting: 0.3  . Smokeless tobacco: Former Network engineer  . Vaping Use: Never used  Substance and Sexual Activity  . Alcohol use: Yes    Comment: occasional  . Drug use: No  . Sexual activity: Not on file    Comment: 18 mg nicotene via vapor cigs  Other Topics Concern  . Not on file  Social History Narrative  . Not on file   Social Determinants of Health   Financial Resource Strain: Not on file  Food Insecurity: Not on file  Transportation Needs: Not on file  Physical Activity: Not on file  Stress: Not on file  Social Connections: Not on file  Intimate Partner Violence: Not on file    History reviewed. No pertinent family history. No Known Allergies  ? Current Facility-Administered Medications  Medication Dose Route Frequency Provider Last Rate Last Admin  . acetaminophen (TYLENOL) tablet 650 mg  650 mg Oral Q6H PRN Athena Masse, MD       Or  . acetaminophen (TYLENOL) suppository 650 mg  650 mg Rectal Q6H PRN Athena Masse, MD      . atorvastatin (LIPITOR) tablet 10 mg  10 mg Oral Daily Wieting, Richard, MD      . cariprazine (VRAYLAR) capsule 3 mg  3 mg Oral Daily Wieting, Richard, MD      . cefTRIAXone (ROCEPHIN) 2 g in sodium chloride 0.9 % 100 mL IVPB  2 g Intravenous Q24H Loletha Grayer, MD 200 mL/hr at 10/04/20 [id number] Infusion Verify at 10/04/20 [id number]  . clonazePAM (KLONOPIN) tablet 0.5 mg  0.5 mg Oral BID PRN Loletha Grayer, MD      . enoxaparin (LOVENOX) 100 mg/mL injection 155 mg  1 mg/kg Subcutaneous Q12H Dallie Piles, RPH      . furosemide (LASIX) tablet 40 mg  40 mg Oral q1800 Wieting, Richard, MD      . gabapentin (NEURONTIN) tablet 600 mg  600 mg Oral QID Wieting, Richard, MD      . HYDROcodone-acetaminophen (NORCO/VICODIN) 5-325 MG per tablet 1-2 tablet  1-2 tablet Oral Q4H PRN Athena Masse, MD   2 tablet at 10/04/20 0535  . [START ON 10/05/2020] influenza vac split quadrivalent PF (FLUARIX) injection 0.5 mL  0.5 mL Intramuscular Tomorrow-1000 Judd Gaudier V, MD      . ketorolac (TORADOL) 30 MG/ML injection 30 mg  30 mg Intravenous Q6H PRN Athena Masse, MD      . ondansetron [facility]) tablet 4 mg  4 mg Oral Q6H PRN Athena Masse, MD  Or  . ondansetron (ZOFRAN) injection 4 mg  4 mg Intravenous Q6H PRN Athena Masse, MD      . pantoprazole (PROTONIX) EC tablet 40 mg  40 mg Oral Daily Wieting, Richard, MD      . traZODone (DESYREL) tablet 50 mg  50 mg Oral QHS Wieting, Richard, MD      . vancomycin (VANCOREADY) IVPB 1250 mg/250 mL  1,250 mg Intravenous Q8H Dallie Piles, RPH      . [START ON 10/05/2020] venlafaxine XR (EFFEXOR-XR) 24 hr capsule 150 mg  150 mg Oral Q breakfast Wieting, Richard, MD         Abtx:  Anti-infectives (From admission, onward)   Start     Dose/Rate Route Frequency Ordered Stop   10/04/20 1400  vancomycin (VANCOREADY) IVPB 1250 mg/250 mL        1,250 mg 166.7 mL/hr over 90 Minutes Intravenous Every 8 hours 10/04/20 0802     10/04/20 1200  vancomycin (VANCOCIN) IVPB 1000 mg/200 mL premix  Status:  Discontinued       "And" Linked Group Details   1,000 mg 200 mL/hr over 60 Minutes Intravenous Every 12 hours 10/04/20 0132 10/04/20 0802   10/04/20 1200  vancomycin (VANCOREADY) IVPB 1250 mg/250 mL  Status:  Discontinued       "And" Linked Group Details   1,250 mg 166.7 mL/hr over 90 Minutes Intravenous Every 12 hours 10/04/20 0132 10/04/20 0802   10/04/20 0619  cefTRIAXone (ROCEPHIN) 2 g in sodium chloride 0.9 % 100 mL IVPB        2 g 200 mL/hr over 30  Minutes Intravenous Every 24 hours 10/04/20 0619     10/04/20 0100  vancomycin (VANCOREADY) IVPB 1500 mg/300 mL        1,500 mg 150 mL/hr over 120 Minutes Intravenous  Once 10/04/20 0051 10/04/20 0407   10/04/20 0030  cefTRIAXone (ROCEPHIN) 2 g in sodium chloride 0.9 % 100 mL IVPB  Status:  Discontinued        2 g 200 mL/hr over 30 Minutes Intravenous Every 24 hours 10/04/20 0028 10/04/20 0619   10/03/20 2300  vancomycin (VANCOCIN) IVPB 1000 mg/200 mL premix        1,000 mg 200 mL/hr over 60 Minutes Intravenous  Once 10/03/20 2254 10/04/20 0038      REVIEW OF SYSTEMS:  Const: negative fever, negative chills, 100 pound weight loss since sept 2021 Eyes: negative diplopia or visual changes, negative eye pain ENT: negative coryza, negative sore throat Resp: negative cough, hemoptysis, dyspnea Cards: negative for chest pain, palpitations, lower extremity edema GU: negative for frequency, dysuria and hematuria GI: Negative for abdominal pain, diarrhea, bleeding, constipation Skin: negative for rash and pruritus Heme: negative for easy bruising and gum/nose bleeding MS: generalized weakness Neurolo:unable to move lower extremities Psych: negative for feelings of anxiety, depression  Endocrine:  diabetes Allergy/Immunology- negative for any medication or food allergies ?  Objective:  VITALS:  BP 121/71 (BP Location: Right Arm)   Pulse 86   Temp 98.4 F (36.9 C) (Oral)   Resp 16   Ht 6\' 2"  (1.88 m)   Wt (!) 156.4 kg   SpO2 97%   BMI 44.27 kg/m  PHYSICAL EXAM:  General: Alert, cooperative, no distress, appears stated age.  Head: Normocephalic, without obvious abnormality, atraumatic. Eyes: Conjunctivae clear, anicteric sclerae. Pupils are equal ENT Nares normal. No drainage or sinus tenderness. Lips, mucosa, and tongue normal. No Thrush Neck: Supple, symmetrical, no adenopathy, thyroid:  non tender no carotid bruit and no JVD. Back: sacral wound picture  reviewed     Appears clean overall Lungs: Clear to auscultation bilaterally. No Wheezing or Rhonchi. No rales. Heart: Regular rate and rhythm, no murmur, rub or gallop. Abdomen: Soft, non-tender,not distended. Bowel sounds normal. No masses PEG, colosotmy Extremities: edema Skin: No rashes or lesions. Or bruising Lymph: Cervical, supraclavicular normal. Neurologic: does not move rt leg, left leg minimal movt upper extremities better mobility Pertinent Labs Lab Results CBC    Component Value Date/Time   WBC 6.9 10/04/2020 0500   RBC 3.45 (L) 10/04/2020 0500   HGB 10.3 (L) 10/04/2020 0500   HCT 32.6 (L) 10/04/2020 0500   PLT 214 10/04/2020 0500   MCV 94.5 10/04/2020 0500   MCH 29.9 10/04/2020 0500   MCHC 31.6 10/04/2020 0500   RDW 15.0 10/04/2020 0500   LYMPHSABS 1.0 10/03/2020 1823   MONOABS 0.6 10/03/2020 1823   EOSABS 0.2 10/03/2020 1823   BASOSABS 0.1 10/03/2020 1823    CMP Latest Ref Rng & Units 10/04/2020 10/03/2020 08/17/2019  Glucose 70 - 99 mg/dL 97 139(H) 142(H)  BUN 6 - 20 mg/dL 10 10 16   Creatinine 0.61 - 1.24 mg/dL 0.62 0.72 1.20  Sodium 135 - 145 mmol/L 139 139 140  Potassium 3.5 - 5.1 mmol/L 3.6 3.4(L) 3.7  Chloride 98 - 111 mmol/L 102 102 102  CO2 22 - 32 mmol/L 26 26 27   Calcium 8.9 - 10.3 mg/dL 9.9 9.9 9.0  Total Protein 6.5 - 8.1 g/dL - 7.3 7.3  Total Bilirubin 0.3 - 1.2 mg/dL - 0.4 0.6  Alkaline Phos 38 - 126 U/L - 126 79  AST 15 - 41 U/L - 22 26  ALT 0 - 44 U/L - 16 37      Microbiology: Recent Results (from the past 240 hour(s))  Blood culture (routine x 2)     Status: None (Preliminary result)   Collection Time: 10/03/20 11:30 PM   Specimen: BLOOD  Result Value Ref Range Status   Specimen Description BLOOD LEFT ANTECUBITAL  Final   Special Requests   Final    BOTTLES DRAWN AEROBIC AND ANAEROBIC Blood Culture results may not be optimal due to an inadequate volume of blood received in culture bottles   Culture   Final    NO GROWTH < 12  HOURS Performed at [facility], [address], [place], [place] [address]    Report Status PENDING  Incomplete  Blood culture (routine x 2)     Status: None (Preliminary result)   Collection Time: 10/03/20 11:30 PM   Specimen: BLOOD  Result Value Ref Range Status   Specimen Description BLOOD RIGHT ANTECUBITAL  Final   Special Requests   Final    BOTTLES DRAWN AEROBIC AND ANAEROBIC Blood Culture results may not be optimal due to an inadequate volume of blood received in culture bottles   Culture   Final    NO GROWTH < 12 HOURS Performed at [facility], [address] [place], [place] [address]    Report Status PENDING  Incomplete  SARS Coronavirus 2 by RT PCR (hospital order, performed in [place] hospital lab) Nasopharyngeal Nasopharyngeal Swab     Status: None   Collection Time: 10/03/20 11:30 PM   Specimen: Nasopharyngeal Swab  Result Value Ref Range Status   SARS Coronavirus 2 NEGATIVE NEGATIVE Final    Comment: (NOTE) SARS-CoV-2 target nucleic acids are NOT DETECTED.  The SARS-CoV-2 RNA is generally  detectable in upper and lower respiratory specimens during the acute phase of infection. The lowest concentration of SARS-CoV-2 viral copies this assay can detect is 250 copies / mL. A negative result does not preclude SARS-CoV-2 infection and should not be used as the sole basis for treatment or other patient management decisions.  A negative result may occur with improper specimen collection / handling, submission of specimen other than nasopharyngeal swab, presence of viral mutation(s) within the areas targeted by this assay, and inadequate number of viral copies (<250 copies / mL). A negative result must be combined with clinical observations, patient history, and epidemiological information.  Fact Sheet for Patients:   StrictlyIdeas.no  Fact Sheet for Healthcare Providers: BankingDealers.co.za  This  test is not yet approved or  cleared by the [place] FDA and has been authorized for detection and/or diagnosis of SARS-CoV-2 by FDA under an Emergency Use Authorization (EUA).  This EUA will remain in effect (meaning this test can be used) for the duration of the COVID-19 declaration under Section 564(b)(1) of the Act, 21 U.S.C. section 360bbb-3(b)(1), unless the authorization is terminated or revoked sooner.  Performed at [facility], [address] [place], [place] [address]     IMAGING RESULTS: 10/03/2020 Sacral decubitus ulcer with findings concerning for osteomyelitis involving the coccyx and lower sacrum Partially visualized indeterminate mass involving the right gluteus maximus muscle.  09/20/2020 CT of the abdomen and pelvis at [facility].  Sacral decubitus with surrounding soft tissue inflammation which extends to involve the distal sacrum and coccyx which demonstrates cortical destruction and heterotopic ossification.  Findings are concerning for subacute to chronic osteomyelitis.    ? 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 Patient currently on vancomycin and ceftriaxone. The wound looks clean.  Will need plastic surgery to evaluate the patient for closure of the wound.  If there is suspicion for chronic osteomyelitis then we need to do a bone biopsy for pathology and culture and if positive for infection then would treat him with IV antibiotics and also recommend resection of the coccyx, in order to close the wound surgically.   He needs to offload the wound, increase mobility and improve nutrition in order to heal the wound.     Morbid obesity.  Has recently reduced 100 pounds  after hospitalization in September.  Post ICU neuropathy/myopathy and weakness legs  Anemia  ___________________________________________________ Discussed with patient, and the hospitalist and surgeon. Note:  This document was prepared using Dragon voice recognition software and may include unintentional dictation errors.

## 2020-10-04 NOTE — H&P (Addendum)
History and Physical    Edward Soto AOZ:[id number] DOB: [birth year] DOA: 10/03/2020  PCP: Deanne Coffer, MD   Patient coming from: home  I have personally briefly reviewed patient's old medical records in [place]  Chief Complaint: chest pain  HPI: Edward Soto is a 45 y.o. 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  Patient had recurrent fevers during his prolonged hospitalization, and also had a febrile illness on 09/03/20 and was diagnosed with COVID at the time, treated with 3 days of remdesivir.  Also seen by surgery on 1/18 for RUQ pain believe related to prior omental infarct or posttraumatic/inflammatory process, treated with NSAIDs. The current episode of chest pain was described as stabbing sensation in the mid chest, 6 out of 10, nonradiating, no associated nausea, vomiting or diaphoresis, shortness of breath or lightheadedness.  Started early in the day and after persisting for a few hours he took an oxycodone and Tylenol which resolved the pain by the time EMS arrived.  EMS noted him to be tachycardic and with a temperature of 100.1 ED Course: On arrival to the ER, temp 99.2, pulse 109, BP 122/83 with O2 sat 96% on room air.  WBC normal but lactic acid elevated at 2.7>1.6.  Hemoglobin 10.7.  Troponin  5 EKG as reviewed by me : Sinus tachycardia at 110 with no acute ST-T wave changes Imaging: CTA chest negative for PE or for acute intrathoracic process.  CT abdomen and pelvis showing sacral decubitus ulcer with findings concerning for osteomyelitis involving the coccyx and lower sacrum as well as indeterminate mass of the right gluteus maximus muscle.  Chest x-ray showed mild pulmonary vascular congestion  Review of Systems: As per HPI otherwise all other systems on review of systems negative.    Past Medical History:  Diagnosis Date  . Acute encephalopathy   . Acute on chronic respiratory failure with hypoxia ([place])   . Aspiration pneumonia due to gastric secretions ([place])   . Depression   . DVT (deep venous thrombosis) (HCC)    right leg  . H/O seasonal allergies   . Traumatic hemothorax     Past Surgical History:  Procedure Laterality Date  . FRACTURE SURGERY Right   . KNEE ARTHROSCOPY Right   . MASS EXCISION Right 09/24/2013   Procedure: EXCISION MASS;  Surgeon: Newt Minion, MD;  Location: [place];  Service: Orthopedics;  Laterality: Right;  Right Foot Excision Dorsal and Plantar Hemangioma     reports that he quit smoking about 4 months ago. His smoking use included cigarettes. He has a 17.00 pack-year smoking history. He has quit using smokeless tobacco. He reports current alcohol use. He reports that he does not use drugs.  No Known Allergies  History reviewed. No pertinent family history.  Family history reviewed, no pertinent family history   Prior to Admission medications   Medication Sig Start Date End Date Taking? Authorizing Provider  cariprazine (VRAYLAR) capsule Take 3 mg by mouth daily.    [provider]  clonazePAM (KLONOPIN) 0.5 MG tablet Take 0.5 mg by mouth 2 (two) times daily as needed for anxiety.    [provider]    Physical Exam: Vitals:   10/03/20 1900 10/03/20 1930 10/03/20 2000 10/03/20 2200  BP: 130/88 127/77 121/71 123/76   Pulse: 95 92 (!) 101 93  Resp: 16 16 (!) 25 (!) 22  Temp:      SpO2: 91% 95% 97% 93%  Weight:      Height:         Vitals:   10/03/20 1900 10/03/20 1930 10/03/20 2000 10/03/20 2200  BP: 130/88 127/77 121/71 123/76  Pulse: 95 92 (!) 101 93  Resp: 16 16 (!) 25 (!) 22  Temp:      SpO2: 91% 95% 97% 93%  Weight:      Height:          Constitutional: Alert and oriented x 3 . Not in any apparent distress HEENT:      Head: Normocephalic and atraumatic.         Eyes: PERLA, EOMI, Conjunctivae are normal. Sclera is non-icteric.       Mouth/Throat: Mucous membranes are moist.       Neck: Supple with no signs of meningismus.  Tracheostoma wound with well affronted borders Cardiovascular:  Tachycardic. No murmurs, gallops, or rubs. 2+ symmetrical distal pulses are present . No JVD.  1+ LE edema Respiratory: Respiratory effort normal .Lungs sounds clear bilaterally. No wheezes, crackles, or rhonchi.  Gastrointestinal: Soft, non tender, and non distended with positive bowel sounds.  Genitourinary: No CVA tenderness. Musculoskeletal: Nontender with normal range of motion in all extremities. No cyanosis, or erythema of extremities. Neurologic:  Face is symmetric. Moving all extremities.  Weakness to right upper extremity and bilateral lower extremities  skin: Sacral decubitus Psychiatric: Mood and affect are normal    Labs on Admission: I have personally reviewed following labs and imaging studies  CBC: Recent Labs  Lab 10/03/20 1823  WBC 7.5  NEUTROABS 5.6  HGB 10.7*  HCT 34.5*  MCV 95.0  PLT [id number]   Basic Metabolic Panel: Recent Labs  Lab 10/03/20 1823  NA 139  K 3.4*  CL 102  CO2 26  GLUCOSE 139*  BUN 10  CREATININE 0.72  CALCIUM 9.9   GFR: Estimated Creatinine Clearance: 180.9 mL/min (by C-G formula based on SCr of 0.72 mg/dL). Liver Function Tests: Recent Labs  Lab 10/03/20 1823  AST 22  ALT 16  ALKPHOS 126  BILITOT 0.4  PROT 7.3  ALBUMIN 3.5   No  results for input(s): LIPASE, AMYLASE in the last 168 hours. No results for input(s): AMMONIA in the last 168 hours. Coagulation Profile: Recent Labs  Lab 10/03/20 1823  INR 1.4*   Cardiac Enzymes: No results for input(s): CKTOTAL, CKMB, CKMBINDEX, TROPONINI in the last 168 hours. BNP (last 3 results) No results for input(s): PROBNP in the last 8760 hours. HbA1C: No results for input(s): HGBA1C in the last 72 hours. CBG: No results for input(s): GLUCAP in the last 168 hours. Lipid Profile: No results for input(s): CHOL, HDL, LDLCALC, TRIG, CHOLHDL, LDLDIRECT in the last 72 hours. Thyroid Function Tests: No results for input(s): TSH, T4TOTAL, FREET4, T3FREE, THYROIDAB in the last 72 hours. Anemia Panel: No results for input(s): VITAMINB12, FOLATE, FERRITIN, TIBC, IRON, RETICCTPCT in the last 72 hours. Urine analysis:    Component Value Date/Time  COLORURINE YELLOW (A) 05/26/2017 1529   APPEARANCEUR CLEAR (A) 05/26/2017 1529   LABSPEC 1.014 05/26/2017 1529   PHURINE 5.0 05/26/2017 1529   GLUCOSEU NEGATIVE 05/26/2017 1529   HGBUR NEGATIVE 05/26/2017 1529   BILIRUBINUR NEGATIVE 05/26/2017 1529   KETONESUR NEGATIVE 05/26/2017 1529   PROTEINUR NEGATIVE 05/26/2017 1529   NITRITE NEGATIVE 05/26/2017 1529   LEUKOCYTESUR NEGATIVE 05/26/2017 1529    Radiological Exams on Admission: CT Angio Chest PE W and/or Wo Contrast  Result Date: 10/03/2020 CLINICAL DATA:  Chest pain, diaphoresis, lower extremity pain EXAM: CT ANGIOGRAPHY CHEST WITH CONTRAST TECHNIQUE: Multidetector CT imaging of the chest was performed using the standard protocol during bolus administration of intravenous contrast. Multiplanar CT image reconstructions and MIPs were obtained to evaluate the vascular anatomy. CONTRAST:  134mL OMNIPAQUE IOHEXOL 350 MG/ML SOLN COMPARISON:  10/03/2020 FINDINGS: Cardiovascular: This is a technically adequate evaluation of the pulmonary vasculature. No filling defects or pulmonary emboli.  The heart is unremarkable without pericardial effusion. There is a small pericardial cyst along the left heart border, measuring 1.8 cm in size. No evidence of thoracic aortic aneurysm or dissection. Mediastinum/Nodes: Multiple hypodense nodules are seen within the thyroid, largest in the lower pole left lobe measuring up to 2.1 cm in maximal dimension. 10 mm nonspecific right paratracheal lymph node is identified. No other pathologic adenopathy. Trachea and esophagus are unremarkable. Lungs/Pleura: No acute airspace disease, effusion, or pneumothorax. Subsegmental atelectasis or scarring within the lingula. Dependent atelectasis within the bilateral lower lobes. Central airways are patent. Upper Abdomen: Percutaneous gastrostomy tube within the gastric lumen. No acute upper abdominal process. Musculoskeletal: Chronic posttraumatic and postsurgical changes within the right thoracic cage. There are numerous bilateral healed rib fractures. No acute bony abnormalities. Reconstructed images demonstrate no additional findings. Review of the MIP images confirms the above findings. IMPRESSION: 1. No evidence of pulmonary embolus. 2. No acute intrathoracic process. 3. Multiple hypodense thyroid nodules, largest measuring 2.1 cm. Recommend nonemergent thyroid US. (Ref: J Am Coll Radiol. 2015 Feb;12(2): 143-50). Electronically Signed   By: Randa Ngo M.D.   On: 10/03/2020 21:25   CT PELVIS WO CONTRAST  Result Date: 10/03/2020 CLINICAL DATA:  Sacral wound.  Hip pain. EXAM: CT PELVIS WITHOUT CONTRAST TECHNIQUE: Multidetector CT imaging of the pelvis was performed following the standard protocol without intravenous contrast. COMPARISON:  CT dated 05/26/2017 FINDINGS: Urinary Tract:  The urinary bladder is filled with contrast. Bowel:  Unremarkable visualized pelvic bowel loops. Vascular/Lymphatic: No pathologically enlarged lymph nodes. No significant vascular abnormality seen. Reproductive:  No mass or other significant  abnormality Other:  None. Musculoskeletal: There is extensive presacral soft tissue edema and fat stranding. There is a large soft tissue defect overlying the coccyx and sacrum. There are destructive changes of the coccyx and lower sacrum. There is a partially visualized mass in the right gluteus maximus muscle measuring approximately 6.3 cm (axial series 3, image 73). IMPRESSION: 1. Sacral decubitus ulcer with findings concerning for osteomyelitis involving the coccyx and lower sacrum. 2. Partially visualized, indeterminate mass involving the right gluteus maximus muscle. This could represent a hematoma or soft tissue mass. Correlation with the patient's history is recommended. This can be further evaluated by ultrasound as clinically indicated. Electronically Signed   By: Constance Holster M.D.   On: 10/03/2020 22:52   DG Chest Port 1 View  Result Date: 10/03/2020 CLINICAL DATA:  Sepsis EXAM: PORTABLE CHEST 1 VIEW COMPARISON:  July 28, 2018 FINDINGS: The heart size and mediastinal contours are within normal  limits. There is prominence of the central pulmonary vasculature. The visualized skeletal structures are unremarkable. IMPRESSION: Mild pulmonary vascular congestion Electronically Signed   By: Prudencio Pair M.D.   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 with Eliquis,  bilateral lower extremity and RUL weakness, now essentially bedbound with EMG suggesting neuropathy/myopathy, discharged to [facility] where he stayed from 08/14/20, discharged home less than 2 weeks ago,  presenting with atypical chest pain, but found to be febrile and tachycardic   Sepsis ([place])   Infected sacral decubitus ulcer, stage IV ([place])   Acute on chronic osteomyelitis ([place]) -Patient with fever and tachycardia and stage IV decubitus ulcer -CT abdomen and pelvis showing osteomyelitis of coccyx and lower sacrum -IV fluids -IV Rocephin and vancomycin -Wound care consult -We will hold Eliquis in case patient needs debridement and replaced with subcu Lovenox for history of DVT  Chest pain, atypical -Low suspicion for cardiac etiology.  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  Patient not on oxygen -Tracheostomy decannulated on 12/28    Obesity, Class III, BMI 40-49.9 (morbid obesity) (HCC)   Physical deconditioning   Chronic lower extremity and right upper extremity weakness   Bedbound status -Complicating factors to overall prognosis and care -PT OT and TOC consults -Patient had EMG studies of lower extremities during his prolonged hospitalization and outpatient follow-up with neurology was recommended    Chronic anticoagulation due to history of DVT -Patient with DVT in 3/4 extremities during prolonged hospitalization -Replaced Eliquis with treatment dose Lovenox for now in case patient needs debridement in OR   DVT prophylaxis: Lovenox  Code Status: full code   Family Communication:  none  Disposition Plan: Back to previous home environment Consults called: none  Status:At the time of admission, it appears that the appropriate admission status for this patient is INPATIENT. This is judged to be reasonable and necessary in order to provide the required intensity of service to ensure the patient's safety given the presenting symptoms, physical exam findings, and initial radiographic and laboratory data in the context of their  Comorbid conditions.   Patient requires inpatient status due to high intensity of service, high risk for further deterioration and high frequency of surveillance required.   I certify that at the point of admission it is my clinical judgment that the patient will require inpatient hospital care spanning beyond [place] MD [facility]     10/04/2020, 12:28 AM

## 2020-10-04 NOTE — Progress Notes (Signed)
Pharmacy Antibiotic Note  Edward Soto is a 45 y.o. male admitted on 10/03/2020 with cellulitis.  Pharmacy was consulted for vancomycin dosing. He received a total of 2500 mg IV vancomycin as a loading dose. His renal function is stable and at apparent baseline.  Plan: adjust vancomycin dose to 1250 mg IV every 8 hours  Goal AUC 400-550  Expected AUC: 410.1  SCr used: 0.8 mg/dL (rounded up)  Daily SCr while on IV vancomycin to assess renal function   Height: 6\' 2"  (188 cm) Weight: (!) 156.4 kg (344 lb 12.8 oz) IBW/kg (Calculated) : 82.2  Temp (24hrs), Avg:99 F (37.2 C), Min:98.8 F (37.1 C), Max:99.2 F (37.3 C)  Recent Labs  Lab 10/03/20 1823 10/03/20 2330 10/04/20 0500  WBC 7.5  --  6.9  CREATININE 0.72  --  0.62  LATICACIDVEN 2.7* 1.6  --     Estimated Creatinine Clearance: 184.6 mL/min (by C-G formula based on SCr of 0.62 mg/dL).    No Known Allergies  Antimicrobials this admission: vancomycin 2/6 >>  ceftriaxone 2/7 >>   Microbiology results: 2/6 BCx: NG < 12 hours 2/6 SARS CoV-2: negative  Thank you for allowing pharmacy to be a part of this patient's care.  Dallie Piles 10/04/2020 7:47 AM

## 2020-10-04 NOTE — Progress Notes (Signed)
Pharmacy Antibiotic Note  Edward Soto is a 45 y.o. male admitted on 10/03/2020 with cellulitis.  Pharmacy has been consulted for Vancomycin dosing.  Plan: Vancomycin 1000 mg IV X 1 given in ED on 2/7 @ 2338. Additional Vancomycin 1500 mg IV X 1 ordered to make total loading dose of 2500 mg.  Vancomycin 2250 mg IV Q12H ordered to start on 2/7 @ 1200.  AUC = 509.7 Vanc trough = 13   Height: 6\' 2"  (188 cm) Weight: (!) 151 kg (333 lb) IBW/kg (Calculated) : 82.2  Temp (24hrs), Avg:99.2 F (37.3 C), Min:99.2 F (37.3 C), Max:99.2 F (37.3 C)  Recent Labs  Lab 10/03/20 1823 10/03/20 2330  WBC 7.5  --   CREATININE 0.72  --   LATICACIDVEN 2.7* 1.6    Estimated Creatinine Clearance: 180.9 mL/min (by C-G formula based on SCr of 0.72 mg/dL).    No Known Allergies  Antimicrobials this admission:   >>    >>   Dose adjustments this admission:   Microbiology results:  BCx:   UCx:    Sputum:    MRSA PCR:   Thank you for allowing pharmacy to be a part of this patient's care.  Edward Soto D 10/04/2020 1:33 AM

## 2020-10-04 NOTE — Progress Notes (Signed)
Unable to complete dressing change due to pt yelling for left hip and right knee pain and  cursing as well.

## 2020-10-04 NOTE — NC FL2 (Signed)
[place] LEVEL OF CARE SCREENING TOOL     IDENTIFICATION  Patient Name: Edward Soto Birthdate: May 15, 1976 Sex: male Admission Date (Current Location): 10/03/2020  [employer] [place] [place] and [place] Number:  Engineering geologist and Address:  [facility], [address], [place], [place] [address]      Provider Number: [id number]  Attending Physician Name and Address:  Loletha Grayer, MD  Relative Name and Phone Number:       Current Level of Care: Hospital Recommended Level of Care: [place] Prior Approval Number:    Date Approved/Denied:   PASRR Number: [id number]  Discharge Plan: SNF    Current Diagnoses: Patient Active Problem List   Diagnosis Date Noted  . Sepsis ([place]) 10/04/2020  . 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 History of tracheostomy 10/04/2020  . Obesity, Class III, BMI 40-49.9 (morbid obesity) ([place]) 10/04/2020  . Decubitus ulcer of sacral region, stage 4 ([place]) 10/04/2020  . Acute on chronic osteomyelitis ([place]) 10/04/2020  . Infected sacral decubitus ulcer, stage IV ([place]) 10/04/2020  . Physical deconditioning 10/04/2020  . Lower extremity weakness 10/04/2020  . History of DVT (deep vein thrombosis) 10/04/2020  . Chronic anticoagulation 10/04/2020  . Chest pain 10/04/2020  . Bedbound 10/04/2020  . Acute on chronic respiratory failure with hypoxia ([place])   . Acute encephalopathy   . Traumatic hemothorax   . Aspiration pneumonia due to gastric secretions (HCC)     Orientation RESPIRATION BLADDER Height & Weight     Self,Time,Situation,Place  Normal (Tracheostomy decannulated on 12/28) Continent Weight: (!) 344 lb 12.8 oz (156.4 kg) Height:  6\' 2"  (188 cm)  BEHAVIORAL SYMPTOMS/MOOD NEUROLOGICAL BOWEL NUTRITION STATUS   (None)  (None) Colostomy Feeding tube (Regular diet. Has a PEG but it's clamped because patient is eating.)  AMBULATORY STATUS COMMUNICATION OF NEEDS Skin   Total Care Verbally PU  Stage and Appropriate Care,Other (Comment) (Cracking. Pressure injury on his sacrum: ABD, wet-to-dry, gauze.)                       Personal Care Assistance Level of Assistance              Functional Limitations Info  Sight,Hearing,Speech Sight Info: Adequate Hearing Info: Adequate Speech Info: Adequate    SPECIAL CARE FACTORS FREQUENCY  PT (By licensed PT),OT (By licensed OT)     PT Frequency: 5 x week OT Frequency: 5 x week            Contractures Contractures Info: Not present    Additional Factors Info  Code Status,Allergies Code Status Info: Full code Allergies Info: NKDA           Current Medications (10/04/2020):  This is the current hospital active medication list Current Facility-Administered Medications  Medication Dose Route Frequency Provider Last Rate Last Admin  . acetaminophen (TYLENOL) tablet 650 mg  650 mg Oral Q6H PRN Athena Masse, MD       Or  . acetaminophen (TYLENOL) suppository 650 mg  650 mg Rectal Q6H PRN Athena Masse, MD      . atorvastatin (LIPITOR) tablet 10 mg  10 mg Oral Daily Wieting, Richard, MD      . cariprazine (VRAYLAR) capsule 3 mg  3 mg Oral Daily Wieting, Richard, MD      . cefTRIAXone (ROCEPHIN) 2 g in sodium chloride 0.9 % 100 mL IVPB  2 g Intravenous Q24H Loletha Grayer, MD 200 mL/hr at 10/04/20  6962 Infusion Verify at 10/04/20 [phone]  . clonazePAM (KLONOPIN) tablet 0.5 mg  0.5 mg Oral BID PRN Loletha Grayer, MD      . enoxaparin (LOVENOX) 100 mg/mL injection 155 mg  1 mg/kg Subcutaneous Q12H Dallie Piles, RPH      . furosemide (LASIX) tablet 40 mg  40 mg Oral q1800 Wieting, Richard, MD      . gabapentin (NEURONTIN) tablet 600 mg  600 mg Oral QID Wieting, Richard, MD      . HYDROcodone-acetaminophen (NORCO/VICODIN) 5-325 MG per tablet 1-2 tablet  1-2 tablet Oral Q4H PRN Athena Masse, MD   2 tablet at 10/04/20 0535  . [START ON 10/05/2020] influenza vac split quadrivalent PF (FLUARIX) injection 0.5 mL  0.5 mL  Intramuscular Tomorrow-1000 Judd Gaudier V, MD      . ketorolac (TORADOL) 30 MG/ML injection 30 mg  30 mg Intravenous Q6H PRN Athena Masse, MD      . ondansetron [facility]) tablet 4 mg  4 mg Oral Q6H PRN Athena Masse, MD       Or  . ondansetron [facility]) injection 4 mg  4 mg Intravenous Q6H PRN Athena Masse, MD      . pantoprazole (PROTONIX) EC tablet 40 mg  40 mg Oral Daily Wieting, Richard, MD      . traZODone (DESYREL) tablet 50 mg  50 mg Oral QHS Wieting, Richard, MD      . vancomycin (VANCOREADY) IVPB 1250 mg/250 mL  1,250 mg Intravenous Q8H Dallie Piles, RPH      . [START ON 10/05/2020] venlafaxine XR (EFFEXOR-XR) 24 hr capsule 150 mg  150 mg Oral Q breakfast Loletha Grayer, MD         Discharge Medications: Please see discharge summary for a list of discharge medications.  Relevant Imaging Results:  Relevant Lab Results:   Additional Information SS#: [id number]  Candie Chroman, LCSW

## 2020-10-04 NOTE — Progress Notes (Signed)
Wound culture sample sent to lab.

## 2020-10-05 DIAGNOSIS — G6289 Other specified polyneuropathies: Secondary | ICD-10-CM

## 2020-10-05 DIAGNOSIS — G629 Polyneuropathy, unspecified: Secondary | ICD-10-CM

## 2020-10-05 DIAGNOSIS — R509 Fever, unspecified: Secondary | ICD-10-CM

## 2020-10-05 DIAGNOSIS — T148XXA Other injury of unspecified body region, initial encounter: Secondary | ICD-10-CM

## 2020-10-05 DIAGNOSIS — Z86718 Personal history of other venous thrombosis and embolism: Secondary | ICD-10-CM

## 2020-10-05 DIAGNOSIS — L089 Local infection of the skin and subcutaneous tissue, unspecified: Secondary | ICD-10-CM

## 2020-10-05 LAB — CBC
HCT: 33.7 % — ABNORMAL LOW (ref 39.0–52.0)
Hemoglobin: 10.5 g/dL — ABNORMAL LOW (ref 13.0–17.0)
MCH: 29.6 pg (ref 26.0–34.0)
MCHC: 31.2 g/dL (ref 30.0–36.0)
MCV: 94.9 fL (ref 80.0–100.0)
Platelets: 210 10*3/uL (ref 150–400)
RBC: 3.55 MIL/uL — ABNORMAL LOW (ref 4.22–5.81)
RDW: 15 % (ref 11.5–15.5)
WBC: 5.2 10*3/uL (ref 4.0–10.5)
nRBC: 0 % (ref 0.0–0.2)

## 2020-10-05 LAB — T4, FREE: Free T4: 1.09 ng/dL (ref 0.61–1.12)

## 2020-10-05 LAB — COMPREHENSIVE METABOLIC PANEL
ALT: 15 U/L (ref 0–44)
AST: 15 U/L (ref 15–41)
Albumin: 3.4 g/dL — ABNORMAL LOW (ref 3.5–5.0)
Alkaline Phosphatase: 109 U/L (ref 38–126)
Anion gap: 9 (ref 5–15)
BUN: 9 mg/dL (ref 6–20)
CO2: 28 mmol/L (ref 22–32)
Calcium: 10.4 mg/dL — ABNORMAL HIGH (ref 8.9–10.3)
Chloride: 105 mmol/L (ref 98–111)
Creatinine, Ser: 0.69 mg/dL (ref 0.61–1.24)
GFR, Estimated: >60 mL/min (ref >60)
Glucose, Bld: 106 mg/dL — ABNORMAL HIGH (ref 70–99)
Potassium: 3.8 mmol/L (ref 3.5–5.1)
Sodium: 142 mmol/L (ref 135–145)
Total Bilirubin: 0.6 mg/dL (ref 0.3–1.2)
Total Protein: 7.2 g/dL (ref 6.5–8.1)

## 2020-10-05 LAB — C-REACTIVE PROTEIN: CRP: 1.5 mg/dL — ABNORMAL HIGH (ref <1.0)

## 2020-10-05 LAB — PROCALCITONIN: Procalcitonin: <0.1 ng/mL

## 2020-10-05 LAB — TSH: TSH: 1.2 u[IU]/mL (ref 0.350–4.500)

## 2020-10-05 LAB — SEDIMENTATION RATE: Sed Rate: 1 mm/hr (ref 0–15)

## 2020-10-05 MED ORDER — WARFARIN SODIUM 10 MG PO TABS
10.0000 mg | ORAL_TABLET | Freq: Once | ORAL | Status: AC
  Administered 2020-10-05: 10 mg via ORAL
  Filled 2020-10-05: qty 1

## 2020-10-05 MED ORDER — WARFARIN - PHARMACIST DOSING INPATIENT: Freq: Every day | Status: DC

## 2020-10-05 MED ORDER — SODIUM CHLORIDE 0.9 % IV SOLN
3.0000 g | Freq: Four times a day (QID) | INTRAVENOUS | Status: DC
  Administered 2020-10-05 – 2020-10-07 (×8): 3 g via INTRAVENOUS
  Filled 2020-10-05 (×9): qty 8
  Filled 2020-10-05: qty 2.14
  Filled 2020-10-05 (×2): qty 8

## 2020-10-05 NOTE — Progress Notes (Signed)
  Chaplain On-Call was paged by Unit Epimenio Sarin with request from patient for visit by the [place].  Met patient at bedside and provided much listening support as patient described his relief that an insurance burden has been resolved, and also how much he misses being at home with his wife and seven-year-old grandson.  Chaplain provided spiritual and emotional support.  Florence Adin Laker M.Div., [facility]

## 2020-10-05 NOTE — Progress Notes (Signed)
Patient ID: Edward Soto, male   DOB: Jan 19, 1976, 45 y.o.   MRN: [id number] [facility] PROGRESS NOTE  JEANCLAUDE WENTWORTH XLK:[id number] DOB: Jul 31, 1976 DOA: 10/03/2020 PCP: Deanne Coffer, MD  HPI/Subjective: Patient today physically feeling better.  He was very happy with the care here.  He was very upset when his insurance company did not cover this hospital.  [employer] company was asking for a transfer to a covered facility.  Admitted with fever and drainage from his decubiti.  Objective: Vitals:   10/05/20 1144 10/05/20 1612  BP: 104/63 (!) 108/58  Pulse: 86 92  Resp: 16 18  Temp: 98.7 F (37.1 C) 98.7 F (37.1 C)  SpO2: 94% 96%    Intake/Output Summary (Last 24 hours) at 10/05/2020 1758 Last data filed at 10/05/2020 1300 Gross per 24 hour  Intake 1080 ml  Output 2185 ml  Net -1105 ml   Filed Weights   10/03/20 1818 10/04/20 0538  Weight: (!) 151 kg (!) 156.4 kg    ROS: Review of Systems  Respiratory: Negative for cough and shortness of breath.   Cardiovascular: Negative for chest pain.  Gastrointestinal: Negative for abdominal pain, nausea and vomiting.   Exam: Physical Exam HENT:     Head: Normocephalic.     Mouth/Throat:     Pharynx: No oropharyngeal exudate.  Eyes:     General: Lids are normal.     Conjunctiva/sclera: Conjunctivae normal.  Cardiovascular:     Rate and Rhythm: Normal rate and regular rhythm.     Heart sounds: Normal heart sounds, S1 normal and S2 normal.  Pulmonary:     Breath sounds: Examination of the right-lower field reveals decreased breath sounds. Examination of the left-lower field reveals decreased breath sounds. Decreased breath sounds present. No wheezing, rhonchi or rales.  Abdominal:     Palpations: Abdomen is soft.     Tenderness: There is no abdominal tenderness.  Musculoskeletal:     Right ankle: Swelling present.     Left ankle: Swelling present.  Skin:    General: Skin is warm.     Comments: Chronic lower extremity  skin discoloration  Neurological:     Mental Status: He is alert and oriented to person, place, and time.       Data Reviewed: Basic Metabolic Panel: Recent Labs  Lab 10/03/20 1823 10/04/20 0500 10/05/20 0448  NA 139 139 142  K 3.4* 3.6 3.8  CL 102 102 105  CO2 26 26 28   GLUCOSE 139* 97 106*  BUN 10 10 9   CREATININE 0.72 0.62 0.69  CALCIUM 9.9 9.9 10.4*   Liver Function Tests: Recent Labs  Lab 10/03/20 1823 10/05/20 0448  AST 22 15  ALT 16 15  ALKPHOS 126 109  BILITOT 0.4 0.6  PROT 7.3 7.2  ALBUMIN 3.5 3.4*   CBC: Recent Labs  Lab 10/03/20 1823 10/04/20 0500 10/05/20 0448  WBC 7.5 6.9 5.2  NEUTROABS 5.6  --   --   HGB 10.7* 10.3* 10.5*  HCT 34.5* 32.6* 33.7*  MCV 95.0 94.5 94.9  PLT 222 214 210     Recent Results (from the past 240 hour(s))  Blood culture (routine x 2)     Status: None (Preliminary result)   Collection Time: 10/03/20 11:30 PM   Specimen: BLOOD  Result Value Ref Range Status   Specimen Description BLOOD LEFT ANTECUBITAL  Final   Special Requests   Final    BOTTLES DRAWN AEROBIC AND ANAEROBIC Blood Culture results may  not be optimal due to an inadequate volume of blood received in culture bottles   Culture   Final    NO GROWTH 2 DAYS Performed at [facility], [address] [place], [place] [address]    Report Status PENDING  Incomplete  Blood culture (routine x 2)     Status: None (Preliminary result)   Collection Time: 10/03/20 11:30 PM   Specimen: BLOOD  Result Value Ref Range Status   Specimen Description BLOOD RIGHT ANTECUBITAL  Final   Special Requests   Final    BOTTLES DRAWN AEROBIC AND ANAEROBIC Blood Culture results may not be optimal due to an inadequate volume of blood received in culture bottles   Culture   Final    NO GROWTH 2 DAYS Performed at [facility], [address] [place], [place] [address]    Report Status PENDING  Incomplete  SARS Coronavirus 2 by RT PCR (hospital order,  performed in [place] hospital lab) Nasopharyngeal Nasopharyngeal Swab     Status: None   Collection Time: 10/03/20 11:30 PM   Specimen: Nasopharyngeal Swab  Result Value Ref Range Status   SARS Coronavirus 2 NEGATIVE NEGATIVE Final    Comment: (NOTE) SARS-CoV-2 target nucleic acids are NOT DETECTED.  The SARS-CoV-2 RNA is generally detectable in upper and lower respiratory specimens during the acute phase of infection. The lowest concentration of SARS-CoV-2 viral copies this assay can detect is 250 copies / mL. A negative result does not preclude SARS-CoV-2 infection and should not be used as the sole basis for treatment or other patient management decisions.  A negative result may occur with improper specimen collection / handling, submission of specimen other than nasopharyngeal swab, presence of viral mutation(s) within the areas targeted by this assay, and inadequate number of viral copies (<250 copies / mL). A negative result must be combined with clinical observations, patient history, and epidemiological information.  Fact Sheet for Patients:   StrictlyIdeas.no  Fact Sheet for Healthcare Providers: BankingDealers.co.za  This test is not yet approved or  cleared by the [place] FDA and has been authorized for detection and/or diagnosis of SARS-CoV-2 by FDA under an Emergency Use Authorization (EUA).  This EUA will remain in effect (meaning this test can be used) for the duration of the COVID-19 declaration under Section 564(b)(1) of the Act, 21 U.S.C. section 360bbb-3(b)(1), unless the authorization is terminated or revoked sooner.  Performed at [facility], [address] [place], [place] [address]   Aerobic/Anaerobic Culture (surgical/deep wound)     Status: None (Preliminary result)   Collection Time: 10/04/20 12:19 PM   Specimen: Decubitis; Wound  Result Value Ref Range Status   Specimen Description    Final    DECUBITIS Performed at [facility], [address], [place], [place] [address]    Special Requests   Final    Normal Performed at [facility], [place], [place] [address]    Gram Stain NO WBC SEEN NO ORGANISMS SEEN   Final   Culture   Final    RARE KLEBSIELLA PNEUMONIAE RARE ACINETOBACTER BAUMANNII SUSCEPTIBILITIES TO FOLLOW Performed at [facility], [place] [address], [place], [place] [address]    Report Status PENDING  Incomplete     Studies: CT Angio Chest PE W and/or Wo Contrast  Result Date: 10/03/2020 CLINICAL DATA:  Chest pain, diaphoresis, lower extremity pain EXAM: CT ANGIOGRAPHY CHEST WITH CONTRAST TECHNIQUE: Multidetector CT imaging of the chest  was performed using the standard protocol during bolus administration of intravenous contrast. Multiplanar CT image reconstructions and MIPs were obtained to evaluate the vascular anatomy. CONTRAST:  110mL OMNIPAQUE IOHEXOL 350 MG/ML SOLN COMPARISON:  10/03/2020 FINDINGS: Cardiovascular: This is a technically adequate evaluation of the pulmonary vasculature. No filling defects or pulmonary emboli. The heart is unremarkable without pericardial effusion. There is a small pericardial cyst along the left heart border, measuring 1.8 cm in size. No evidence of thoracic aortic aneurysm or dissection. Mediastinum/Nodes: Multiple hypodense nodules are seen within the thyroid, largest in the lower pole left lobe measuring up to 2.1 cm in maximal dimension. 10 mm nonspecific right paratracheal lymph node is identified. No other pathologic adenopathy. Trachea and esophagus are unremarkable. Lungs/Pleura: No acute airspace disease, effusion, or pneumothorax. Subsegmental atelectasis or scarring within the lingula. Dependent atelectasis within the bilateral lower lobes. Central airways are patent. Upper Abdomen: Percutaneous gastrostomy tube within the gastric lumen. No acute upper abdominal process.  Musculoskeletal: Chronic posttraumatic and postsurgical changes within the right thoracic cage. There are numerous bilateral healed rib fractures. No acute bony abnormalities. Reconstructed images demonstrate no additional findings. Review of the MIP images confirms the above findings. IMPRESSION: 1. No evidence of pulmonary embolus. 2. No acute intrathoracic process. 3. Multiple hypodense thyroid nodules, largest measuring 2.1 cm. Recommend nonemergent thyroid US. (Ref: J Am Coll Radiol. 2015 Feb;12(2): 143-50). Electronically Signed   By: Randa Ngo M.D.   On: 10/03/2020 21:25   CT PELVIS WO CONTRAST  Result Date: 10/03/2020 CLINICAL DATA:  Sacral wound.  Hip pain. EXAM: CT PELVIS WITHOUT CONTRAST TECHNIQUE: Multidetector CT imaging of the pelvis was performed following the standard protocol without intravenous contrast. COMPARISON:  CT dated 05/26/2017 FINDINGS: Urinary Tract:  The urinary bladder is filled with contrast. Bowel:  Unremarkable visualized pelvic bowel loops. Vascular/Lymphatic: No pathologically enlarged lymph nodes. No significant vascular abnormality seen. Reproductive:  No mass or other significant abnormality Other:  None. Musculoskeletal: There is extensive presacral soft tissue edema and fat stranding. There is a large soft tissue defect overlying the coccyx and sacrum. There are destructive changes of the coccyx and lower sacrum. There is a partially visualized mass in the right gluteus maximus muscle measuring approximately 6.3 cm (axial series 3, image 73). IMPRESSION: 1. Sacral decubitus ulcer with findings concerning for osteomyelitis involving the coccyx and lower sacrum. 2. Partially visualized, indeterminate mass involving the right gluteus maximus muscle. This could represent a hematoma or soft tissue mass. Correlation with the patient's history is recommended. This can be further evaluated by ultrasound as clinically indicated. Electronically Signed   By: Constance Holster  M.D.   On: 10/03/2020 22:52   DG Chest Port 1 View  Result Date: 10/03/2020 CLINICAL DATA:  Sepsis EXAM: PORTABLE CHEST 1 VIEW COMPARISON:  July 28, 2018 FINDINGS: The heart size and mediastinal contours are within normal limits. There is prominence of the central pulmonary vasculature. The visualized skeletal structures are unremarkable. IMPRESSION: Mild pulmonary vascular congestion Electronically Signed   By: Prudencio Pair M.D.   On: 10/03/2020 19:20    Scheduled Meds: . atorvastatin  10 mg Oral Daily  . cariprazine  4.5 mg Oral Daily  . enoxaparin (LOVENOX) injection  1 mg/kg Subcutaneous Q12H  . furosemide  40 mg Oral q1800  . gabapentin  600 mg Oral QID  . influenza vac split quadrivalent PF  0.5 mL Intramuscular Tomorrow-1000  . mometasone-formoterol  2 puff Inhalation BID  . pantoprazole  40 mg Oral  Daily  . traZODone  50 mg Oral QHS  . venlafaxine XR  150 mg Oral Q breakfast  . Warfarin - Pharmacist Dosing Inpatient   Does not apply q1600   Continuous Infusions: . ampicillin-sulbactam (UNASYN) IV     Brief history.   Patient admitted 10/05/2019 with suspected sepsis with drainage from decubitus ulcer.  Sepsis ruled out.  Patient initially on vancomycin and Rocephin.  Switched over to Unasyn with Acinetobacter Baumanni and Klebsiella pneumoniae growing out of wound culture.  Seen by general surgery also.   Today I was notified by the insurance company does not cover this hospital and I need to transfer the patient.  I did a peer to peer review at 4 PM with Dr. Oneita Kras at [phone] from [place].  She stated that the patient does not have a choice and needs to be transferred.  I called over to [facility] and the transfer center told me that they did not have any beds especially for today.  I called back Dr. Oneita Kras from [place] and told her that there were no beds for today and she approved today.  She wanted me to get him on the wait list.  I spoke with Dr. Denice Paradise at [facility].  The patient was  not accepted for transfer or even the wait list.   Assessment/Plan:  1. Fever with purulent drainage from likely stage III sacral decubiti.  Sepsis ruled out.  Likely has more of a chronic osteomyelitis.  Blood cultures are negative so far.  Wound culture showing Acinetobacter Baumann high and Klebsiella pneumoniae.  Antibiotics changed to Unasyn by ID. 2. History of DVT.  Restart Coumadin.  On Lovenox injections until INR comes up. 3. Atypical chest pain.  CT scan of the chest negative for pulmonary embolism and cardiac enzymes negative.  Could be secondary to infection. 4. Recent COVID-19 infection on 09/03/2020.  Respiratory status stable. 5. Morbid obesity with a BMI of 44.27.  Not sure if we have an accurate weight on this patient. 6. Hyperlipidemia unspecified on atorvastatin 7. Peripheral neuropathy.  Continue gabapentin 8. Anxiety and depression.  Continue psychiatric medications and trazodone for sleep.  Patient very upset after me telling him that his insurance company does not cover this hospital.  He asked to speak with the chaplain.  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 (Active)  10/03/20 2130  Location: Sacrum  Location Orientation: Right  Staging: Stage 4 - Full thickness tissue loss with exposed bone, tendon or muscle.  Wound Description (Comments):   Present on Admission: Yes       Code Status:     Code Status Orders  (From admission, onward)         Start     Ordered   10/04/20 0025  Full code  Continuous        10/04/20 0028        Code Status History    This patient has a current code status but no historical code status.   Advance Care Planning Activity     Family Communication: Spoke with the wife on the phone twice. Disposition Plan: Status is: Inpatient  Dispo: The patient is from: Home              Anticipated d/c is to: Rehab              Anticipated d/c date is:  Whenever we get a rehab bed available.  Patient currently receiving IV antibiotics for infection of decubiti   Difficult to place patient.  Likely will be difficult to place because insurance company will need him to do 3 hours of intense work with physical therapy.  Consultants:  Infectious disease  General surgery  Antibiotics:  IV Unasyn  Time spent: 75 minutes spent trying to coordinate care for this patient and speaking with numerous transitional care team personnel, peer to peer with the insurance company and speaking with [facility] transfer center.  [place]  [facility] [employer]

## 2020-10-05 NOTE — TOC Progression Note (Addendum)
Transition of Care [facility]) - Progression Note    Patient Details  Name: Edward Soto MRN: [id number] Date of Birth: [birth year]  Transition of Care [facility]) CM/SW Ju[place] Diaz, LCSW Phone Number: 10/05/2020, 10:14 AM  Clinical Narrative:  Received call from [facility] representative, Luisa Hart. They are requesting that patient transfer to an in-network facility. The closest are [facility], [facility], and [facility]. Ms. Christiane Ha said the medical director will approve the first day for emergent care but likely will not cover anything after that. MD is aware and will discuss with patient before calling one of these other hospitals.   3:20 pm: No bed offers yet. Asked [facility] to review since they are in network with his plan. Discussed with patient and wife. Gave patient list that [employer] with in-network SNF's so he and wife could review when she arrived. They are aware we may need to look into out-of-county facilities due to insurance, care needs, and weight. [employer] medical director has denied hospitalization coverage from today onward. Patient and wife are aware. Wife called customer service line and they said he needs a letter of approval stating that he needs treatment here in this hospital. MD and UR are aware.   Expected Discharge Plan: [place] Barriers to Discharge: Insurance Authorization,Continued Medical Work up  Expected Discharge Plan and Services Expected Discharge Plan: Carrier Choice: Energy arrangements for the past 2 months: Apartment                                       Social Determinants of Health (SDOH) Interventions    Readmission Risk Interventions No flowsheet data found.

## 2020-10-05 NOTE — Consult Note (Addendum)
[facility] Nurse Consult Note: Surgical team performed a consult for sacrum wound; refer to their progress notes.  Requested to recommend topical treatment for the wound. Pt states he has been using moist gauze packing prior to admission. Right buttock/sacrum with chronic stage 4 pressure injury  100% red, mod amt yellow drainage, no odor.  7X7X4cm, bone palpable Pressure Injury POA: Yes Dressing procedure/placement/frequency: Pt is on a low airloss mattress to reduce pressure. Topical treatment orders provided for bedside nurses to perform as follows: Apply saline moistened gauze packing to sacrum wound Q day, then cover with ABD pad and tape. Pt could benefit from home health assistance after discharge for dressing change assistance; please order if desired. Please re-consult if further assistance is needed.  Thank-you,  Julien Girt MSN, [place], [place], [place], [place]

## 2020-10-05 NOTE — Progress Notes (Signed)
Physical Therapy Treatment Patient Details Name: Edward Soto MRN: [id number] DOB: 30-Jul-1976 Today's Date: 10/05/2020    History of Present Illness Pt is a 45yo M admitted to [facility] 10/03/20 for c/o chest pain and SOB. Pt tachycardic, febrile, and had elevated lactic acid levels upon admission. Pt with stage IV sacral decubitus ulcer that was previously being addressed with HH. 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 Pt was DC from LTAC and was home less than 2 weeks before coming to ED. Imaging remarkable for sacral decubitus ulcer with findings concerning for osteomyelitis involving the coccyx, lower sacrum, and R glute max.    PT Comments    Patient received in bed, he is agreeable to PT. Patient is unable to move either LE due to pain and weakness. Tolerates minimal movement of LEs to improve ROM and for pain control. Patient rolled in bed with mod assist. Patient is very pain limited yelling out and swearing throughout mobility. Patient will continue to benefit from skilled PT while here to improve functional mobility and strength.     Follow Up Recommendations  SNF     Equipment Recommendations  Other (comment) (TBD)    Recommendations for Other Services       Precautions / Restrictions Precautions Precautions: Fall Restrictions Weight Bearing Restrictions: No Other Position/Activity Restrictions: significant RLE (knee) pain    Mobility  Bed Mobility Overal bed mobility: Needs Assistance Bed Mobility: Rolling Rolling: Mod assist;+2 for physical assistance         General bed mobility comments: Max assist +2 for supine posterior/lateral scooting for repositioning in bed. Pt able to  pull through BUE on handrail with cues. Unable to assist with BLE due to pain/weakness.  Transfers                 General transfer comment: deferred due to pain/anxiety  Ambulation/Gait             General Gait Details: Teacher, adult education [place] (Stroke Patients Only)       Balance       Sitting balance - Comments: Unable to assess due to pt declining EOB mobility d/t pain and weakness; will likely need assistance due to pt PLOF                                    Cognition Arousal/Alertness: Awake/alert Behavior During Therapy: [facility] for tasks assessed/performed;Anxious Overall Cognitive Status: Within Functional Limits for tasks assessed                                 General Comments: Patient is agreeable, swearing throughout session due to pain.      Exercises Other Exercises Other Exercises: Pt RLE repositioned in neutral position for pain management after PROM performed. Increased pain behaviors noted with RLE mobility; PROM/AROM limited due to pain.    General Comments        Pertinent Vitals/Pain Pain Assessment: Faces Faces Pain Scale: Hurts worst Pain Location: Patient yelling out, swearing throughout movement. Pain Descriptors /  Indicators: Guarding;Grimacing;Moaning;Sore Pain Intervention(s): Limited activity within patient's tolerance;Monitored during session;Repositioned    Home Living                      Prior Function            PT Goals (current goals can now be found in the care plan section) Acute Rehab PT Goals Patient Stated Goal: Be active with grandchildren PT Goal Formulation: With patient Time For Goal Achievement: 10/18/20 Potential to Achieve Goals: Fair Progress towards PT goals: Progressing toward goals    Frequency    Min 2X/week      PT Plan Current plan remains appropriate    Co-evaluation PT/OT/SLP  Co-Evaluation/Treatment: Yes Reason for Co-Treatment: For patient/therapist safety;To address functional/ADL transfers PT goals addressed during session: Mobility/safety with mobility;Strengthening/ROM        AM-PAC PT "6 Clicks" Mobility   Outcome Measure  Help needed turning from your back to your side while in a flat bed without using bedrails?: Total Help needed moving from lying on your back to sitting on the side of a flat bed without using bedrails?: Total Help needed moving to and from a bed to a chair (including a wheelchair)?: Total Help needed standing up from a chair using your arms (e.g., wheelchair or bedside chair)?: Total Help needed to walk in hospital room?: Total Help needed climbing 3-5 steps with a railing? : Total 6 Click Score: 6    End of Session   Activity Tolerance: Patient limited by pain Patient left: in bed;with call bell/phone within reach Nurse Communication: Mobility status;Patient requests pain meds PT Visit Diagnosis: Muscle weakness (generalized) (M62.81);Difficulty in walking, not elsewhere classified (R26.2);Other symptoms and signs involving the nervous system (R29.898);Hemiplegia and hemiparesis;Pain Hemiplegia - Right/Left: Right Hemiplegia - caused by: Unspecified Pain - Right/Left: Right Pain - part of body: Knee;Leg     Time: 8882-8003 PT Time Calculation (min) (ACUTE ONLY): 20 min  Charges:  $Therapeutic Activity: 8-22 mins                     Braulio Kiedrowski, PT, GCS 10/05/20,4:16 PM

## 2020-10-05 NOTE — Progress Notes (Signed)
[place] for transition from Lovenox to warfarin  Indication: DVT  No Known Allergies  Patient Measurements: Height: 6\' 2"  (188 cm) Weight: (!) 156.4 kg (344 lb 12.8 oz) IBW/kg (Calculated) : 82.2  Vital Signs: Temp: 98.7 F (37.1 C) (02/08 1144) BP: 104/63 (02/08 1144) Pulse Rate: 86 (02/08 1144)  Labs: Recent Labs    10/03/20 1823 10/04/20 0500 10/05/20 0448  HGB 10.7* 10.3* 10.5*  HCT 34.5* 32.6* 33.7*  PLT 222 214 210  LABPROT 16.3* 16.2*  --   INR 1.4* 1.4*  --   CREATININE 0.72 0.62 0.69  TROPONINIHS 5  --   --     Estimated Creatinine Clearance: 184.6 mL/min (by C-G formula based on SCr of 0.69 mg/dL).   Medical History: Past Medical History:  Diagnosis Date  . Acute encephalopathy   . Acute on chronic respiratory failure with hypoxia ([place])   . Aspiration pneumonia due to gastric secretions ([place])   . Depression   . DVT (deep venous thrombosis) (HCC)    right leg  . H/O seasonal allergies   . Traumatic hemothorax     Medications:  Medications Prior to Admission  Medication Sig Dispense Refill Last Dose  . atorvastatin (LIPITOR) 10 MG tablet Take 10 mg by mouth daily.   10/02/2020 at 2000  . furosemide (LASIX) 40 MG tablet Take 40 mg by mouth.   10/03/2020 at 0730  . gabapentin (NEURONTIN) 600 MG tablet Take 600 mg by mouth 4 (four) times daily.   10/03/2020 at 0730  . oxyCODONE (OXY IR/ROXICODONE) 5 MG immediate release tablet Take 5 mg by mouth every 6 (six) hours as needed.   10/03/2020 at 0730  . pantoprazole (PROTONIX) 40 MG tablet Take 40 mg by mouth daily.   10/03/2020 at 0700  . traZODone (DESYREL) 50 MG tablet Take 50 mg by mouth at bedtime.   10/02/2020 at 2000  . venlafaxine XR (EFFEXOR-XR) 150 MG 24 hr capsule Take 150 mg by mouth daily with breakfast.   10/03/2020 at 0730  . VRAYLAR 4.5 MG CAPS Take 4.5 mg by mouth daily.   10/03/2020 at 0730  . warfarin (COUMADIN) 6 MG tablet Take 6 mg by mouth daily.   10/03/2020 at  0730  . clonazePAM (KLONOPIN) 0.5 MG tablet Take 0.5 mg by mouth 2 (two) times daily as needed for anxiety. (Patient not taking: No sig reported)   Not Taking at Unknown time    Assessment: Pharmacy consulted to dose lovenox in this 45 year old male admitted with  fever and drainage from his sacral wound. Prior to admission he was on warfarin 6 mg daily. He has been on enoxaparin 1 mg/kg twice daily since 10/04/20 am. H&H and platelets are stable. INR was subtherapeutic on admission.  DDIs: ceftriaxone, vancomycin, ketorolac (prn),   Goal of Therapy:  INR 2.0 - 3.0 Monitor platelets by anticoagulation protocol: Yes   Plan:   Continue enoxaparin 1 mg/kg SQ Q12H until two consecutive therapeutic INRs have been achieved  Warfarin 10 mg today  Daily INR   monitor CBC/platelets at least every 3 days per protocol  Dallie Piles 10/05/2020,3:05 PM

## 2020-10-05 NOTE — Plan of Care (Addendum)
Pt Axox4. Pt placed on Bariatric bed overnight. Prn pain meds adm per MD orders. On IV Abx. Pt tolerates po intake. Pt uses urinal to void. Incontinent at times. Vitals stable. Safety measures in place. Will continue to monitor.  Problem: Education: Goal: Knowledge of General Education information will improve Description: Including pain rating scale, medication(s)/side effects and non-pharmacologic comfort measures Outcome: Progressing   Problem: Health Behavior/Discharge Planning: Goal: Ability to manage health-related needs will improve Outcome: Progressing   Problem: Clinical Measurements: Goal: Ability to maintain clinical measurements within normal limits will improve Outcome: Progressing Goal: Will remain free from infection Outcome: Progressing Goal: Diagnostic test results will improve Outcome: Progressing Goal: Respiratory complications will improve Outcome: Progressing Goal: Cardiovascular complication will be avoided Outcome: Progressing   Problem: Activity: Goal: Risk for activity intolerance will decrease Outcome: Progressing   Problem: Nutrition: Goal: Adequate nutrition will be maintained Outcome: Progressing   Problem: Coping: Goal: Level of anxiety will decrease Outcome: Progressing   Problem: Elimination: Goal: Will not experience complications related to bowel motility Outcome: Progressing Goal: Will not experience complications related to urinary retention Outcome: Progressing   Problem: Pain Managment: Goal: General experience of comfort will improve Outcome: Progressing   Problem: Safety: Goal: Ability to remain free from injury will improve Outcome: Progressing   Problem: Skin Integrity: Goal: Risk for impaired skin integrity will decrease Outcome: Progressing

## 2020-10-06 DIAGNOSIS — L89154 Pressure ulcer of sacral region, stage 4: Secondary | ICD-10-CM | POA: Diagnosis not present

## 2020-10-06 DIAGNOSIS — G9782 Other postprocedural complications and disorders of nervous system: Secondary | ICD-10-CM | POA: Diagnosis not present

## 2020-10-06 DIAGNOSIS — M4628 Osteomyelitis of vertebra, sacral and sacrococcygeal region: Secondary | ICD-10-CM | POA: Diagnosis not present

## 2020-10-06 LAB — PROTIME-INR
INR: 1.2 (ref 0.8–1.2)
Prothrombin Time: 15.1 seconds (ref 11.4–15.2)

## 2020-10-06 LAB — CREATININE, SERUM
Creatinine, Ser: 0.67 mg/dL (ref 0.61–1.24)
GFR, Estimated: >60 mL/min (ref >60)

## 2020-10-06 MED ORDER — WARFARIN SODIUM 10 MG PO TABS
10.0000 mg | ORAL_TABLET | Freq: Once | ORAL | Status: AC
  Administered 2020-10-06: 10 mg via ORAL
  Filled 2020-10-06: qty 1

## 2020-10-06 NOTE — Progress Notes (Signed)
ID Pt doing fine No specific complaints Patient Vitals for the past 24 hrs:  BP Temp Temp src Pulse Resp SpO2  10/06/20 1944 112/65 98 F (36.7 C) -- 92 20 97 %  10/06/20 1655 123/72 98.4 F (36.9 C) Oral 94 18 98 %  10/06/20 0833 115/64 98.3 F (36.8 C) Oral (!) 106 20 96 %  10/06/20 0406 127/70 98.3 F (36.8 C) Oral 90 20 97 %  10/05/20 2341 117/61 98 F (36.7 C) Oral 91 18 96 %    Sacral wound dressing was chanegd this morning Picture   Labs CBC Latest Ref Rng & Units 10/05/2020 10/04/2020 10/03/2020  WBC 4.0 - 10.5 K/uL 5.2 6.9 7.5  Hemoglobin 13.0 - 17.0 g/dL 10.5(L) 10.3(L) 10.7(L)  Hematocrit 39.0 - 52.0 % 33.7(L) 32.6(L) 34.5(L)  Platelets 150 - 400 K/uL 210 214 222   CMP Latest Ref Rng & Units 10/06/2020 10/05/2020 10/04/2020  Glucose 70 - 99 mg/dL - 106(H) 97  BUN 6 - 20 mg/dL - 9 10  Creatinine 0.61 - 1.24 mg/dL 0.67 0.69 0.62  Sodium 135 - 145 mmol/L - 142 139  Potassium 3.5 - 5.1 mmol/L - 3.8 3.6  Chloride 98 - 111 mmol/L - 105 102  CO2 22 - 32 mmol/L - 28 26  Calcium 8.9 - 10.3 mg/dL - 10.4(H) 9.9  Total Protein 6.5 - 8.1 g/dL - 7.2 -  Total Bilirubin 0.3 - 1.2 mg/dL - 0.6 -  Alkaline Phos 38 - 126 U/L - 109 -  AST 15 - 41 U/L - 15 -  ALT 0 - 44 U/L - 15 -   CRP 1.5 ESR 1 procalcitonin < 0.10  Impression/recommendation 45 year old male with complicated medical history with fracture right ninth rib followed by hemothorax in September 2021 leading to prolonged hospitalization at [facility] for 3 months needing tracheostomy which has been decannulated, PEG, colostomy and sacral stage wound stage IV.  Sacral decubitus with possible osteomyelitis as seen in the MRI but ESR and CRP are normal. Patient is currently on Unasyn The wound looks clean and I discussed with surgeon who does not think he may have osteomyelitis and the sacral decubitus is stage III now.  It would be ideal to get plastic surgery to evaluate the wound to see whether it could be closed .  If that is  feasible then a short course of antibiotics will help then.  The superficial culture has Acinetobacter and Klebsiella but the wound Gram stain did not have any WBC or bacteria indicating there is just a contamination. Also ESR is 1 and CRP is 1.5.  This is unlikely to be osteomyelitis.  So will not treat with antibiotics. He may benefit from wound VAC.  He needs to offload the wound.  Increasing mobility and improve nutrition in order to heal the wound.  Morbid obesity.  Has recently reduced 100 pounds since his hospitalization in September  Post ICU neuropathy/myopathy and weakness legs.  Next Chronic anemia  Discussed the management with the patient.  We will evaluate the wound tomorrow.

## 2020-10-06 NOTE — TOC Progression Note (Signed)
Transition of Care [facility]) - Progression Note    Patient Details  Name: Edward Soto MRN: [id number] Date of Birth: 1976/08/11  Transition of Care [facility]) CM/SW [place], LCSW Phone Number: 10/06/2020, 5:26 PM  Clinical Narrative: [facility] has offered patient a bed. Patient is aware and said this was the first bit of good news he's had all day. Per MD, patient may be ready for discharge as early as tomorrow. SNF will start auth and will follow up in the morning regarding if they need to order an air mattress or if they already have one.    Expected Discharge Plan: [place] Barriers to Discharge: Insurance Authorization,Continued Medical Work up  Expected Discharge Plan and Services Expected Discharge Plan: [place] Choice: [place] arrangements for the past 2 months: Apartment                                       Social Determinants of Health (SDOH) Interventions    Readmission Risk Interventions No flowsheet data found.

## 2020-10-06 NOTE — Progress Notes (Signed)
[place] for transition from Lovenox to warfarin  Indication: DVT  Patient Measurements: Height: 6\' 2"  (188 cm) Weight: (!) 156.4 kg (344 lb 12.8 oz) IBW/kg (Calculated) : 82.2  Labs: Recent Labs    10/03/20 1823 10/04/20 0500 10/05/20 0448 10/06/20 0418  HGB 10.7* 10.3* 10.5*  --   HCT 34.5* 32.6* 33.7*  --   PLT 222 214 210  --   LABPROT 16.3* 16.2*  --  15.1  INR 1.4* 1.4*  --  1.2  CREATININE 0.72 0.62 0.69 0.67  TROPONINIHS 5  --   --   --     Estimated Creatinine Clearance: 184.6 mL/min (by C-G formula based on SCr of 0.67 mg/dL).   Medical History: Past Medical History:  Diagnosis Date  . Acute encephalopathy   . Acute on chronic respiratory failure with hypoxia ([place])   . Aspiration pneumonia due to gastric secretions ([place])   . Depression   . DVT (deep venous thrombosis) (HCC)    right leg  . H/O seasonal allergies   . Traumatic hemothorax     Medications:  Warfarin 6 mg daily PTA (last patient reported dose was 2/6)  Assessment: Pharmacy consulted to dose lovenox in this 45 year old male admitted with  fever and drainage from his sacral wound. Prior to admission he was on warfarin 6 mg daily. He has been on enoxaparin 1 mg/kg twice daily since 10/04/20 am. H&H and platelets are stable. INR was subtherapeutic on admission.  DDIs: Unasyn, ketorolac (prn)  Date INR Plan  2/6 1.4 6 mg PTA  2/7 1.4 Held  2/8 No data 10 mg  2/9 1.2 10 mg    Goal of Therapy:  INR 2.0 - 3.0 Monitor platelets by anticoagulation protocol: Yes   Plan:   Continue enoxaparin 1 mg/kg SQ Q12H until two consecutive therapeutic INRs have been achieved  Warfarin 10 mg again today  Daily INR   Monitor CBC/platelets at least every 3 days per protocol  Benita Gutter 10/06/2020,8:08 AM

## 2020-10-06 NOTE — Progress Notes (Signed)
PROGRESS NOTE    Edward Soto  DGL:[id number] DOB: Mar 12, 1976 DOA: 10/03/2020 PCP: Deanne Coffer, MD   Brief Narrative:  45 y.o. 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  Initial concern for sepsis secondary to infected sacral decubitus ulcer however sepsis ruled out at this point.  Antibiotics changed to Unasyn per infectious disease consultant.  Awaiting final recommendations regarding ongoing need for antibiotics.  Was informed that patient's insurance had denied this hospital stay as [facility] is not within network.  Peer-to-peer completed with [employer] representative by Dr. Bobbye Charleston on 10/05/2020.  Subsequent attempts to initiate a transfer process have been unsuccessful.  Hospitalist contacted have refused to accept the transfer or even put the patient on a wait list.  Reasons include lack of bed availability at the accepting facility and lack of need for transfer for higher level of care.  I have explained this issue to the patient at bedside.  He is also been communicated with by case management leadership.  Patient himself is refusing transfer despite being explained the potential ramifications.   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[place])   History of  tracheostomy   Obesity, Class III, BMI 40-49.9 (morbid obesity) (HCC)   Pressure injury of sacral region, stage 3 (HCC)   Acute on chronic osteomyelitis (HCC)   Infected sacral decubitus ulcer, stage IV (HCC)   Physical deconditioning   Lower extremity weakness   History of DVT (deep vein thrombosis)   Chronic anticoagulation   Chest pain   Bedbound   Anxiety and depression   Fever   Infected wound   Peripheral neuropathy  Sacral decubitus ulcer Fever secondary to above Suspected chronic osteomyelitis Patient presentation initially concerning for sepsis, now ruled out Blood cultures no growth to date Wound culture with Acinetobacter and Klebsiella Infectious disease consulted Plan: Continue Unasyn. Follow any infectious disease recommendations  History of DVT Timing of VTE is unclear He is on Lovenox injections as bridge INR remains subtherapeutic Plan: Continue warfarin with pharmacy dosing Continue Lovenox Patient can potentially discharge her on Lovenox bridge if we have a appropriate disposition plan  Atypical chest pain Unclear etiology Troponins negative CTA chest negative for PE Remains on room air Possibly secondary to infection Plan: Telemetry EKG as needed chest pain  History of COVID-19 infection Diagnosed on 09/03/2020 Respiratory status stable, on room air  Morbid obesity BMI 44 This complicates overall care  Hyperlipidemia On atorvastatin  Peripheral neuropathy Pain well controlled Continue gabapentin  Anxiety and depression Continue home psychiatric regimen Trazodone as needed sleep  DVT prophylaxis: Lovenox/warfarin Code Status: Full Family Communication: None today Disposition Plan: Status is: Inpatient  Remains inpatient appropriate because:Inpatient level of care appropriate due to severity of illness   Dispo: The patient is from: Home              Anticipated d/c is to: SNF  Anticipated d/c date is: 2 days               Patient currently is not medically stable to d/c.   Difficult to place patient Yes  Disposition may be an issue.  Currently on IV antibiotics.  Pending infectious disease follow-up and further recommendations regarding ongoing antibiotic needs     Level of care: Med-Surg  Consultants:   ID  Procedures:   None  Antimicrobials:   Unasyn   Subjective: Seen and examined.  Continues to be frustrated about insurance denial.  Pain well controlled.  Objective: Vitals:   10/05/20 2108 10/05/20 2341 10/06/20 0406 10/06/20 0833  BP: 117/69 117/61 127/70 115/64  Pulse: 86 91 90 (!) 106  Resp: 20 18 20 20   Temp: 98.1 F (36.7 C) 98 F (36.7 C) 98.3 F (36.8 C) 98.3 F (36.8 C)  TempSrc:  Oral Oral Oral  SpO2: 93% 96% 97% 96%  Weight:      Height:        Intake/Output Summary (Last 24 hours) at 10/06/2020 1325 Last data filed at 10/06/2020 3734 Gross per 24 hour  Intake 0 ml  Output 2090 ml  Net -2090 ml   Filed Weights   10/03/20 1818 10/04/20 0538  Weight: (!) 151 kg (!) 156.4 kg    Examination:  General exam: Appears calm and comfortable  Respiratory system: Lung sounds decreased at bases.  Normal work of breathing.  Room air Cardiovascular system: S1 & S2 heard, RRR. No JVD, murmurs, rubs, gallops or clicks. No pedal edema. Gastrointestinal system: Abdomen is nondistended, soft and nontender. No organomegaly or masses felt. Normal bowel sounds heard. Central nervous system: Alert and oriented. No focal neurological deficits. Extremities: Symmetric 5 x 5 power.  Bilateral ankle swelling Skin: No rashes, lesions or ulcers.  Well-healing sacral decubitus Psychiatry: Judgement and insight appear normal. Mood & affect appropriate.     Data Reviewed: I have personally reviewed following labs and imaging studies  CBC: Recent Labs  Lab 10/03/20 1823 10/04/20 0500 10/05/20 0448  WBC 7.5 6.9 5.2  NEUTROABS 5.6  --   --   HGB 10.7* 10.3* 10.5*  HCT 34.5*  32.6* 33.7*  MCV 95.0 94.5 94.9  PLT 222 214 287   Basic Metabolic Panel: Recent Labs  Lab 10/03/20 1823 10/04/20 0500 10/05/20 0448 10/06/20 0418  NA 139 139 142  --   K 3.4* 3.6 3.8  --   CL 102 102 105  --   CO2 26 26 28   --   GLUCOSE 139* 97 106*  --   BUN 10 10 9   --   CREATININE 0.72 0.62 0.69 0.67  CALCIUM 9.9 9.9 10.4*  --    GFR: Estimated Creatinine Clearance: 184.6 mL/min (by C-G formula based on SCr of 0.67 mg/dL). Liver Function Tests: Recent Labs  Lab 10/03/20 1823 10/05/20 0448  AST 22 15  ALT 16 15  ALKPHOS 126 109  BILITOT 0.4 0.6  PROT 7.3 7.2  ALBUMIN 3.5 3.4*   No results for input(s): LIPASE, AMYLASE in the last 168 hours. No results for input(s): AMMONIA in the last 168 hours. Coagulation Profile: Recent Labs  Lab 10/03/20 1823 10/04/20 0500 10/06/20 0418  INR 1.4* 1.4* 1.2   Cardiac Enzymes: No results for input(s): CKTOTAL, CKMB, CKMBINDEX, TROPONINI in the last 168 hours. BNP (last 3 results) No results for input(s): PROBNP in the last 8760 hours. HbA1C: No results for input(s): HGBA1C in the last 72 hours.  CBG: No results for input(s): GLUCAP in the last 168 hours. Lipid Profile: No results for input(s): CHOL, HDL, LDLCALC, TRIG, CHOLHDL, LDLDIRECT in the last 72 hours. Thyroid Function Tests: Recent Labs    10/05/20 0448  TSH 1.200  FREET4 1.09   Anemia Panel: No results for input(s): VITAMINB12, FOLATE, FERRITIN, TIBC, IRON, RETICCTPCT in the last 72 hours. Sepsis Labs: Recent Labs  Lab 10/03/20 1823 10/03/20 2330 10/04/20 0500 10/05/20 0448  PROCALCITON  --   --  <0.10 <0.10  LATICACIDVEN 2.7* 1.6  --   --     Recent Results (from the past 240 hour(s))  Blood culture (routine x 2)     Status: None (Preliminary result)   Collection Time: 10/03/20 11:30 PM   Specimen: BLOOD  Result Value Ref Range Status   Specimen Description BLOOD LEFT ANTECUBITAL  Final   Special Requests   Final    BOTTLES DRAWN AEROBIC  AND ANAEROBIC Blood Culture results may not be optimal due to an inadequate volume of blood received in culture bottles   Culture   Final    NO GROWTH 3 DAYS Performed at [facility], [address] [place], [place] [address]    Report Status PENDING  Incomplete  Blood culture (routine x 2)     Status: None (Preliminary result)   Collection Time: 10/03/20 11:30 PM   Specimen: BLOOD  Result Value Ref Range Status   Specimen Description BLOOD RIGHT ANTECUBITAL  Final   Special Requests   Final    BOTTLES DRAWN AEROBIC AND ANAEROBIC Blood Culture results may not be optimal due to an inadequate volume of blood received in culture bottles   Culture   Final    NO GROWTH 3 DAYS Performed at [facility], [address], [place], [place] [address]    Report Status PENDING  Incomplete  SARS Coronavirus 2 by RT PCR (hospital order, performed in [place] hospital lab) Nasopharyngeal Nasopharyngeal Swab     Status: None   Collection Time: 10/03/20 11:30 PM   Specimen: Nasopharyngeal Swab  Result Value Ref Range Status   SARS Coronavirus 2 NEGATIVE NEGATIVE Final    Comment: (NOTE) SARS-CoV-2 target nucleic acids are NOT DETECTED.  The SARS-CoV-2 RNA is generally detectable in upper and lower respiratory specimens during the acute phase of infection. The lowest concentration of SARS-CoV-2 viral copies this assay can detect is 250 copies / mL. A negative result does not preclude SARS-CoV-2 infection and should not be used as the sole basis for treatment or other patient management decisions.  A negative result may occur with improper specimen collection / handling, submission of specimen other than nasopharyngeal swab, presence of viral mutation(s) within the areas targeted by this assay, and inadequate number of viral copies (<250 copies / mL). A negative result must be combined with clinical observations, patient history, and epidemiological information.  Fact Sheet  for Patients:   StrictlyIdeas.no  Fact Sheet for Healthcare Providers: BankingDealers.co.za  This test is not yet approved or  cleared by the [place] FDA and has been authorized for detection and/or diagnosis of SARS-CoV-2 by FDA under an Emergency Use Authorization (EUA).  This EUA will remain in effect (meaning this test can be used) for the duration of the COVID-19 declaration under Section 564(b)(1) of the Act, 21 U.S.C. section 360bbb-3(b)(1), unless the authorization is terminated or revoked sooner.  Performed at [facility], [address], [place], [place] [address]   Aerobic/Anaerobic Culture (surgical/deep  wound)     Status: None (Preliminary result)   Collection Time: 10/04/20 12:19 PM   Specimen: Decubitis; Wound  Result Value Ref Range Status   Specimen Description   Final    DECUBITIS Performed at [facility], [address], [place], [place] [address]    Special Requests   Final    Normal Performed at [facility], [place],  [address]    Gram Stain NO WBC SEEN NO ORGANISMS SEEN   Final   Culture   Final    RARE KLEBSIELLA PNEUMONIAE RARE ACINETOBACTER CALCOACETICUS/BAUMANNII COMPLEX Confirmed Extended Spectrum Beta-Lactamase Producer (ESBL).  In bloodstream infections from ESBL organisms, carbapenems are preferred over piperacillin/tazobactam. They are shown to have a lower risk of mortality. FOR KLEBSIELLA PNEUMONIAE HOLDING FOR POSSIBLE ANAEROBE Performed at [facility], [place] [address] [place], [place] [address]    Report Status PENDING  Incomplete   Organism ID, Bacteria KLEBSIELLA PNEUMONIAE  Final   Organism ID, Bacteria ACINETOBACTER CALCOACETICUS/BAUMANNII COMPLEX  Final      Susceptibility   Acinetobacter calcoaceticus/baumannii complex - MIC*    CEFTAZIDIME 8 SENSITIVE Sensitive     CIPROFLOXACIN 0.5 SENSITIVE Sensitive      GENTAMICIN 2 SENSITIVE Sensitive     IMIPENEM <=0.25 SENSITIVE Sensitive     PIP/TAZO <=4 SENSITIVE Sensitive     TRIMETH/SULFA <=20 SENSITIVE Sensitive     AMPICILLIN/SULBACTAM <=2 SENSITIVE Sensitive     * RARE ACINETOBACTER CALCOACETICUS/BAUMANNII COMPLEX   Klebsiella pneumoniae - MIC*    AMPICILLIN >=32 RESISTANT Resistant     CEFAZOLIN >=64 RESISTANT Resistant     CEFEPIME >=32 RESISTANT Resistant     CEFTAZIDIME RESISTANT Resistant     CEFTRIAXONE >=64 RESISTANT Resistant     CIPROFLOXACIN 2 INTERMEDIATE Intermediate     GENTAMICIN >=16 RESISTANT Resistant     IMIPENEM <=0.25 SENSITIVE Sensitive     TRIMETH/SULFA >=320 RESISTANT Resistant     AMPICILLIN/SULBACTAM >=32 RESISTANT Resistant     PIP/TAZO 8 SENSITIVE Sensitive     * RARE KLEBSIELLA PNEUMONIAE         Radiology Studies: No results found.      Scheduled Meds: . atorvastatin  10 mg Oral Daily  . cariprazine  4.5 mg Oral Daily  . enoxaparin (LOVENOX) injection  1 mg/kg Subcutaneous Q12H  . furosemide  40 mg Oral q1800  . gabapentin  600 mg Oral QID  . mometasone-formoterol  2 puff Inhalation BID  . pantoprazole  40 mg Oral Daily  . traZODone  50 mg Oral QHS  . venlafaxine XR  150 mg Oral Q breakfast  . warfarin  10 mg Oral ONCE-1600  . Warfarin - Pharmacist Dosing Inpatient   Does not apply q1600   Continuous Infusions: . ampicillin-sulbactam (UNASYN) IV 3 g (10/06/20 1120)     LOS: 2 days    Time spent: 25 minutes    Sidney Ace, MD [facility] Pager [phone]  If 7PM-7AM, please contact night-coverage 10/06/2020, 1:25 PM

## 2020-10-07 DIAGNOSIS — Z7401 Bed confinement status: Secondary | ICD-10-CM

## 2020-10-07 DIAGNOSIS — D6489 Other specified anemias: Secondary | ICD-10-CM

## 2020-10-07 DIAGNOSIS — L89153 Pressure ulcer of sacral region, stage 3: Secondary | ICD-10-CM

## 2020-10-07 LAB — AEROBIC/ANAEROBIC CULTURE W GRAM STAIN (SURGICAL/DEEP WOUND)
Gram Stain: NONE SEEN
Special Requests: NORMAL

## 2020-10-07 LAB — RESP PANEL BY RT-PCR (FLU A&B, COVID) ARPGX2
Influenza A by PCR: NEGATIVE
Influenza B by PCR: NEGATIVE
SARS Coronavirus 2 by RT PCR: NEGATIVE

## 2020-10-07 LAB — PROTIME-INR
INR: 1.3 — ABNORMAL HIGH (ref 0.8–1.2)
Prothrombin Time: 15.3 seconds — ABNORMAL HIGH (ref 11.4–15.2)

## 2020-10-07 MED ORDER — WARFARIN SODIUM 7.5 MG PO TABS
15.0000 mg | ORAL_TABLET | Freq: Once | ORAL | Status: AC
  Administered 2020-10-07: 15 mg via ORAL
  Filled 2020-10-07: qty 2

## 2020-10-07 MED ORDER — HYDROCODONE-ACETAMINOPHEN 5-325 MG PO TABS
1.0000 | ORAL_TABLET | ORAL | 0 refills | Status: AC | PRN
Start: 2020-10-07 — End: 2020-10-10

## 2020-10-07 MED ORDER — ENOXAPARIN SODIUM 300 MG/3ML IJ SOLN: 1.0000 mg/kg | Freq: Two times a day (BID) | INTRAMUSCULAR | Status: AC

## 2020-10-07 MED ORDER — CARIPRAZINE HCL 4.5 MG PO CAPS: 4.5000 mg | ORAL_CAPSULE | Freq: Every day | ORAL | Status: AC

## 2020-10-07 NOTE — Progress Notes (Signed)
Physical Therapy Treatment Patient Details Name: Edward Soto MRN: [id number] DOB: 07-19-1976 Today's Date: 10/07/2020    History of Present Illness Pt is a 45yo M admitted to [facility] on 10/03/20 for c/o chest pain and SOB. Pt tachycardic, febrile, and had elevated lactic acid levels upon admission. Pt with stage IV sacral decubitus ulcer that was previously being addressed with HH. 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 Pt was DC from LTAC and was home less than 2 weeks before coming to ED. Imaging remarkable for sacral decubitus ulcer with findings concerning for osteomyelitis involving the coccyx, lower sacrum, and R glute max.    PT Comments    Pt showed great effort and willingness to try exercises but continues to be very pain limited, need extra time between reps and cuing/encouragement t/o.  He did not wish to try any mobility in order to focus on exercises, LE strengthening.  He was seemingly able to better tolerate exercises today as he was not screaming and swearing t/o the session (though clearly uncomfortable with anything approximating (available) "end ranges."  Note: Late entry for treatment 2/9 PM/    Follow Up Recommendations  SNF     Equipment Recommendations       Recommendations for Other Services       Precautions / Restrictions Precautions Precautions: Fall Restrictions Weight Bearing Restrictions: No    Mobility  Bed Mobility               General bed mobility comments: Pt deferring any mobility this date, wants to focuson LE exercises  Transfers                    Ambulation/Gait                 Stairs             Wheelchair Mobility     Modified Rankin (Stroke Patients Only)       Balance                                            Cognition Arousal/Alertness: Awake/alert Behavior During Therapy: WFL for tasks assessed/performed;Anxious Overall Cognitive Status: Within Functional Limits for tasks assessed                                        Exercises General Exercises - Lower Extremity Ankle Circles/Pumps: AAROM;PROM;15 reps;Both Quad Sets: AROM;10 reps Short Arc Quad: AAROM;AROM;10 reps (lacks TKE b/l) Heel Slides: AAROM;AROM;10 reps (c/o pain with knee "end ranges") Hip ABduction/ADduction: AAROM;AROM;10 reps (again c/o pain with most acts) Other Exercises Other Exercises: hooklying hip Ab/Ad butterflies with resisted ER and AROM IR    General Comments        Pertinent Vitals/Pain Pain Assessment: 0-10 Pain Score: 8  Pain Location: pain in b/l LEs with essentially any movement or any joint, though with patient, guarded activity was able to tolerate reasonably well    Home Living  Prior Function            PT Goals (current goals can now be found in the care plan section) Progress towards PT goals: Progressing toward goals    Frequency    Min 2X/week      PT Plan Current plan remains appropriate    Co-evaluation              AM-PAC PT "6 Clicks" Mobility   Outcome Measure  Help needed turning from your back to your side while in a flat bed without using bedrails?: Total Help needed moving from lying on your back to sitting on the side of a flat bed without using bedrails?: Total Help needed moving to and from a bed to a chair (including a wheelchair)?: Total Help needed standing up from a chair using your arms (e.g., wheelchair or bedside chair)?: Total Help needed to walk in hospital room?: Total Help needed climbing 3-5 steps with a railing? : Total 6 Click Score: 6    End of Session Equipment Utilized  During Treatment: Gait belt Activity Tolerance: Patient limited by pain Patient left: in bed;with call bell/phone within reach Nurse Communication: Mobility status;Patient requests pain meds PT Visit Diagnosis: Muscle weakness (generalized) (M62.81);Difficulty in walking, not elsewhere classified (R26.2);Other symptoms and signs involving the nervous system (R29.898);Hemiplegia and hemiparesis;Pain Hemiplegia - Right/Left: Right Hemiplegia - caused by: Unspecified Pain - Right/Left: Right Pain - part of body: Knee;Leg     Time: 1555-1630 PT Time Calculation (min) (ACUTE ONLY): 35 min  Charges:  $Therapeutic Exercise: 23-37 mins                     Kreg Shropshire, DPT 10/07/2020, 8:24 AM

## 2020-10-07 NOTE — Progress Notes (Signed)
[name] for transition from Lovenox to warfarin  Indication: DVT  Patient Measurements: Height: 6\' 2"  (188 cm) Weight: (!) 156.4 kg (344 lb 12.8 oz) IBW/kg (Calculated) : 82.2  Labs: Recent Labs    10/05/20 0448 10/06/20 0418 10/07/20 0421  HGB 10.5*  --   --   HCT 33.7*  --   --   PLT 210  --   --   LABPROT  --  15.1 15.3*  INR  --  1.2 1.3*  CREATININE 0.69 0.67  --     Estimated Creatinine Clearance: 184.6 mL/min (by C-G formula based on SCr of 0.67 mg/dL).   Medical History: Past Medical History:  Diagnosis Date  . Acute encephalopathy   . Acute on chronic respiratory failure with hypoxia ([place])   . Aspiration pneumonia due to gastric secretions ([place])   . Depression   . DVT (deep venous thrombosis) ([facility])    right leg  . H/O seasonal allergies   . Traumatic hemothorax     Medications:  Warfarin 6 mg daily PTA (last patient reported dose was 2/6)  Assessment: Pharmacy consulted to dose lovenox in this 45 year old male admitted with  fever and drainage from his sacral wound. Prior to admission he was on warfarin 6 mg daily. He has been on enoxaparin 1 mg/kg twice daily since 10/04/20 am. H&H and platelets are stable. INR was subtherapeutic on admission.  DDIs: Unasyn, ketorolac (prn)  Date INR Plan  2/6 1.4 6 mg PTA  2/7 1.4 Held  2/8 No data 10 mg  2/9 1.2 10 mg  2/10 1.3 15 mg    Goal of Therapy:  INR 2.0 - 3.0 Monitor platelets by anticoagulation protocol: Yes   Plan:   Continue enoxaparin 1 mg/kg SQ Q12H until two consecutive therapeutic INRs have been achieved  Warfarin 15 mg today  Daily INR   Monitor CBC/platelets at least every 3 days per protocol  Edward Soto 10/07/2020,7:56 AM

## 2020-10-07 NOTE — Progress Notes (Signed)
Day shift nurse to contact ID Dr Delaine Lame before performing dressing change so she can assess sacral  wound.

## 2020-10-07 NOTE — TOC Transition Note (Signed)
Transition of Care [facility]) - CM/SW Discharge Note   Patient Details  Name: Edward Soto MRN: [id number] Date of Birth: 1976-07-16  Transition of Care [facility]) CM/SW Contact:  Candie Chroman, LCSW Phone Number: 10/07/2020, 3:06 PM   Clinical Narrative:  Patient has orders to discharge to [facility] today. RN will call report to [phone] (Room 3B). EMS transport set up for 4:00. No further concerns. CSW signing off.   Final next level of care: Skilled Nursing Facility Barriers to Discharge: Barriers Resolved   Patient Goals and CMS Choice   CMS Medicare.gov Compare Post Acute Care list provided to:: Patient Choice offered to / list presented to : Patient  Discharge Placement   Existing PASRR number confirmed : 10/04/20          Patient chooses bed at: [facility] Patient to be transferred to facility by: EMS   Patient and family notified of of transfer: 10/07/20  Discharge Plan and Services     Post Acute Care Choice: [place]                               Social Determinants of Health (SDOH) Interventions     Readmission Risk Interventions No flowsheet data found.

## 2020-10-07 NOTE — TOC Progression Note (Signed)
Transition of Care [facility]) - Progression Note    Patient Details  Name: SUHAS ESTIS MRN: [id number] Date of Birth: Jan 23, 1976  Transition of Care [facility]) CM/SW Contact  Candie Chroman, LCSW Phone Number: 10/07/2020, 10:30 AM  Clinical Narrative:  Received call from [place] stating that they have approved insurance authorization for [facility] SNF. SNF admissions coordinator confirmed they have an air mattress for him. Patient can discharge today if cleared by ID. MD will order rapid COVID test. Made SNF aware that patient was COVID positive in early January. Patient confirmed he is fully vaccinated and has had the booster.  Expected Discharge Plan: [place] Barriers to Discharge: Insurance Authorization,Continued Medical Work up  Expected Discharge Plan and Services Expected Discharge Plan: [place] Choice: [place] arrangements for the past 2 months: Apartment                                       Social Determinants of Health (SDOH) Interventions    Readmission Risk Interventions No flowsheet data found.

## 2020-10-07 NOTE — Plan of Care (Signed)
Pt Axox4. On bariatric bed. Prn pain meds adm per MD orders. On IV Abx. Pt tolerates po intake. Pt uses urinal to void. Incontinent at times. Vitals stable. Safety measures in place. Will continue to monitor. Problem: Education: Goal: Knowledge of General Education information will improve Description: Including pain rating scale, medication(s)/side effects and non-pharmacologic comfort measures Outcome: Progressing   Problem: Health Behavior/Discharge Planning: Goal: Ability to manage health-related needs will improve Outcome: Progressing   Problem: Clinical Measurements: Goal: Ability to maintain clinical measurements within normal limits will improve Outcome: Progressing Goal: Will remain free from infection Outcome: Progressing Goal: Diagnostic test results will improve Outcome: Progressing Goal: Respiratory complications will improve Outcome: Progressing Goal: Cardiovascular complication will be avoided Outcome: Progressing   Problem: Activity: Goal: Risk for activity intolerance will decrease Outcome: Progressing   Problem: Nutrition: Goal: Adequate nutrition will be maintained Outcome: Progressing   Problem: Coping: Goal: Level of anxiety will decrease Outcome: Progressing   Problem: Elimination: Goal: Will not experience complications related to bowel motility Outcome: Progressing Goal: Will not experience complications related to urinary retention Outcome: Progressing   Problem: Pain Managment: Goal: General experience of comfort will improve Outcome: Progressing   Problem: Safety: Goal: Ability to remain free from injury will improve Outcome: Progressing

## 2020-10-07 NOTE — Progress Notes (Signed)
ID Patient is waiting to go to [place]. He has no chest pain He has no fever he has no nausea or vomiting  Patient Vitals for the past 24 hrs:  BP Temp Temp src Pulse Resp SpO2  10/07/20 1238 131/73 97.8 F (36.6 C) Oral 82 20 99 %  10/07/20 0800 116/77 - - 83 20 98 %  10/07/20 0408 124/69 98.3 F (36.8 C) Oral 85 20 95 %  10/06/20 2359 (!) 125/54 97.9 F (36.6 C) Oral (!) 54 18 96 %  10/06/20 1944 112/65 98 F (36.7 C) - 92 20 97 %   Awake and alert.  No distress Functional paraparesis Chest bilateral air entry Heart sound S1-S2 Abdomen soft Has a colostomy  Sacral wound examined Filling up Clean Is a stage III.  No bone is seen.  Good granulation tissue   Puckered edges.  Labs CBC Latest Ref Rng & Units 10/05/2020 10/04/2020 10/03/2020  WBC 4.0 - 10.5 K/uL 5.2 6.9 7.5  Hemoglobin 13.0 - 17.0 g/dL 10.5(L) 10.3(L) 10.7(L)  Hematocrit 39.0 - 52.0 % 33.7(L) 32.6(L) 34.5(L)  Platelets 150 - 400 K/uL 210 214 222    CMP Latest Ref Rng & Units 10/06/2020 10/05/2020 10/04/2020  Glucose 70 - 99 mg/dL - 106(H) 97  BUN 6 - 20 mg/dL - 9 10  Creatinine 0.61 - 1.24 mg/dL 0.67 0.69 0.62  Sodium 135 - 145 mmol/L - 142 139  Potassium 3.5 - 5.1 mmol/L - 3.8 3.6  Chloride 98 - 111 mmol/L - 105 102  CO2 22 - 32 mmol/L - 28 26  Calcium 8.9 - 10.3 mg/dL - 10.4(H) 9.9  Total Protein 6.5 - 8.1 g/dL - 7.2 -  Total Bilirubin 0.3 - 1.2 mg/dL - 0.6 -  Alkaline Phos 38 - 126 U/L - 109 -  AST 15 - 41 U/L - 15 -  ALT 0 - 44 U/L - 15 -   ESR 1 CRP 1.5 Procal < 0.10  Superficial wound culture has Acinetobacter and ESBL Klebsiella.  Gram stain no WBC or bacteria seen  Impression/recommendation 45 year old male with complicated medical history with fracture right ninth rib followed by hemothorax in September 2021 leading to prolonged hospitalization at [facility] for 3 months needing tracheostomy which has been  decannulated, PEG, colostomy and sacral stage IV wound with multiple  debridement.  Sacral decubitus stage III is healing well there is no bone exposed.  There is possible osteomyelitis seen in the MRI but ESR is normal CRP is normal.  Patient is currently on Unasyn which can be discontinued.  The superficial wound culture showed rare Acinetobacter and red ESBL Kleb. The Gram stain did not have any WBC or bacteria which makes this colonization.  We will not treat this.  He will benefit from plastic surgery review for possible wound closure He will see the plastic surgery as outpatient.  May be if he is taken for surgery he could have a deep culture and possible antibiotics to help with wound healing.  Morbid obesity.  Has recently reduced 100 pounds since hospitalization in September.  Post ICU neuropathy/myopathy and weakness of legs.  Chronic anemia Patient seen with his nurse.  Discussed the management with the patient and hospitalist.

## 2020-10-07 NOTE — Discharge Summary (Signed)
Physician Discharge Summary  Edward Soto JJO:[id number] DOB: Sep 30, 1975 DOA: 10/03/2020  PCP: Deanne Coffer, MD  Admit date: 10/03/2020 Discharge date: 10/07/2020  Admitted From: SNF Disposition:  SNF  Recommendations for Outpatient Follow-up:  1. Follow up with PCP in 1-2 weeks 2. 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  Initial concern for sepsis secondary to infected sacral decubitus ulcer however sepsis ruled out at this point.  Antibiotics changed to Unasyn per infectious disease consultant.    After confirmation of hemodynamic stability, normal inflammatory markers, lack of febrile illness, lack of leukocytosis and overall healthy appearance of the wound antibiotics discontinued prior to discharge.  General surgery and infectious disease involved during hospital course.  No general surgery intervention however recommend the patient that he seek follow-up with plastic surgery to discuss possible flap closure of sacral decubitus ulcer.  This can be done as an outpatient.   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[place])   History of tracheostomy   Obesity, Class III, BMI 40-49.9 (morbid obesity) (HCC)   Pressure injury of sacral region, stage 3 (HCC)   Acute on chronic osteomyelitis (HCC)   Infected sacral decubitus ulcer, stage IV (HCC)   Physical deconditioning   Lower extremity weakness   History of DVT (deep vein thrombosis)   Chronic anticoagulation   Chest pain   Bedbound   Anxiety and depression   Fever   Infected wound   Peripheral neuropathy  Sacral decubitus ulcer Fever secondary to above Suspected chronic osteomyelitis Patient presentation initially concerning for sepsis, now ruled out Blood cultures no growth to date Wound culture with Acinetobacter and Klebsiella Infectious disease consulted Wound culture felt to be contaminant.  Lack of clinical evidence of infection including normal CRP, lack of leukocytosis, lack of febrile illness Pain reasonably well controlled at time of discharge Anticipate prolonged recovery. Stable for discharge to skilled nursing facility at this point Plan: All antibiotics stopped at time of discharge Follow-up with plastic surgery post discharge for evaluation for flap closure of sacral wound  History of DVT Timing of VTE is unclear He is on Lovenox injections as bridge INR remains subtherapeutic at the time of discharge Will discharge with Lovenox and warfarin for bridging therapy 5 days Lovenox ordered.  SNF pharmacy to follow for warfarin dosing and daily INRs. Once INR greater than 2 Lovenox can be discontinued   Atypical chest pain Unclear etiology Troponins negative CTA chest negative for PE Remains on room air Possibly secondary to infection Resolved at time of discharge  History of COVID-19 infection Diagnosed on 09/03/2020 Respiratory status stable, on room air  Morbid obesity BMI 44 This complicates overall care  Hyperlipidemia On  atorvastatin  Peripheral neuropathy Pain well controlled Continue gabapentin  Anxiety and depression  Continue home psychiatric regimen Trazodone as needed sleep  Discharge Instructions  Discharge Instructions    Diet - low sodium heart healthy   Complete by: As directed    Discharge wound care:   Complete by: As directed    Wound care  Daily      Comments: Apply saline moistened gauze packing to sacrum wound Q day, then cover with ABD pad and tape   Increase activity slowly   Complete by: As directed      Allergies as of 10/07/2020   No Known Allergies     Medication List    STOP taking these medications   clonazePAM 0.5 MG tablet Commonly known as: KLONOPIN   oxyCODONE 5 MG immediate release tablet Commonly known as: Oxy IR/ROXICODONE     TAKE these medications   atorvastatin 10 MG tablet Commonly known as: LIPITOR Take 10 mg by mouth daily.   enoxaparin 100 mg/mL Soln injection Commonly known as: LOVENOX Inject 1.55 mLs (155 mg total) into the skin every 12 (twelve) hours for 5 days. Can stop once INR >2   furosemide 40 MG tablet Commonly known as: LASIX Take 40 mg by mouth.   gabapentin 600 MG tablet Commonly known as: NEURONTIN Take 600 mg by mouth 4 (four) times daily.   HYDROcodone-acetaminophen 5-325 MG tablet Commonly known as: NORCO/VICODIN Take 1-2 tablets by mouth every 4 (four) hours as needed for up to 3 days for moderate pain. For use at SNF only   pantoprazole 40 MG tablet Commonly known as: PROTONIX Take 40 mg by mouth daily.   traZODone 50 MG tablet Commonly known as: DESYREL Take 50 mg by mouth at bedtime.   venlafaxine XR 150 MG 24 hr capsule Commonly known as: EFFEXOR-XR Take 150 mg by mouth daily with breakfast.   Vraylar 4.5 MG Caps Generic drug: Cariprazine HCl Take 4.5 mg by mouth daily. What changed: Another medication with the same name was changed. Make sure you understand how and when to take each.   Cariprazine  HCl 4.5 MG Caps Commonly known as: Vraylar Take 1 capsule (4.5 mg total) by mouth daily. Start taking on: October 08, 2020 What changed:   medication strength  how much to take   warfarin 6 MG tablet Commonly known as: COUMADIN Take 6 mg by mouth daily.            Discharge Care Instructions  (From admission, onward)         Start     Ordered   10/07/20 0000  Discharge wound care:       Comments: Wound care  Daily      Comments: Apply saline moistened gauze packing to sacrum wound Q day, then cover with ABD pad and tape   10/07/20 1128          No Known Allergies  Consultations:  General surgery  Infectious disease   Procedures/Studies: CT Angio Chest PE W and/or Wo Contrast  Result Date: 10/03/2020 CLINICAL DATA:  Chest pain, diaphoresis, lower extremity pain EXAM: CT ANGIOGRAPHY CHEST WITH CONTRAST TECHNIQUE: Multidetector CT imaging of the chest was performed using the standard protocol during bolus administration of intravenous contrast. Multiplanar CT image reconstructions and MIPs were obtained to evaluate the vascular anatomy. CONTRAST:  126mL OMNIPAQUE IOHEXOL 350 MG/ML SOLN COMPARISON:  10/03/2020 FINDINGS: Cardiovascular: This is a technically adequate evaluation of the pulmonary vasculature. No filling defects or pulmonary emboli. The heart is unremarkable without pericardial effusion. There is a small pericardial  cyst along the left heart border, measuring 1.8 cm in size. No evidence of thoracic aortic aneurysm or dissection. Mediastinum/Nodes: Multiple hypodense nodules are seen within the thyroid, largest in the lower pole left lobe measuring up to 2.1 cm in maximal dimension. 10 mm nonspecific right paratracheal lymph node is identified. No other pathologic adenopathy. Trachea and esophagus are unremarkable. Lungs/Pleura: No acute airspace disease, effusion, or pneumothorax. Subsegmental atelectasis or scarring within the lingula. Dependent atelectasis  within the bilateral lower lobes. Central airways are patent. Upper Abdomen: Percutaneous gastrostomy tube within the gastric lumen. No acute upper abdominal process. Musculoskeletal: Chronic posttraumatic and postsurgical changes within the right thoracic cage. There are numerous bilateral healed rib fractures. No acute bony abnormalities. Reconstructed images demonstrate no additional findings. Review of the MIP images confirms the above findings. IMPRESSION: 1. No evidence of pulmonary embolus. 2. No acute intrathoracic process. 3. Multiple hypodense thyroid nodules, largest measuring 2.1 cm. Recommend nonemergent thyroid US. (Ref: J Am Coll Radiol. 2015 Feb;12(2): 143-50). Electronically Signed   By: Randa Ngo M.D.   On: 10/03/2020 21:25   CT PELVIS WO CONTRAST  Result Date: 10/03/2020 CLINICAL DATA:  Sacral wound.  Hip pain. EXAM: CT PELVIS WITHOUT CONTRAST TECHNIQUE: Multidetector CT imaging of the pelvis was performed following the standard protocol without intravenous contrast. COMPARISON:  CT dated 05/26/2017 FINDINGS: Urinary Tract:  The urinary bladder is filled with contrast. Bowel:  Unremarkable visualized pelvic bowel loops. Vascular/Lymphatic: No pathologically enlarged lymph nodes. No significant vascular abnormality seen. Reproductive:  No mass or other significant abnormality Other:  None. Musculoskeletal: There is extensive presacral soft tissue edema and fat stranding. There is a large soft tissue defect overlying the coccyx and sacrum. There are destructive changes of the coccyx and lower sacrum. There is a partially visualized mass in the right gluteus maximus muscle measuring approximately 6.3 cm (axial series 3, image 73). IMPRESSION: 1. Sacral decubitus ulcer with findings concerning for osteomyelitis involving the coccyx and lower sacrum. 2. Partially visualized, indeterminate mass involving the right gluteus maximus muscle. This could represent a hematoma or soft tissue mass.  Correlation with the patient's history is recommended. This can be further evaluated by ultrasound as clinically indicated. Electronically Signed   By: Constance Holster M.D.   On: 10/03/2020 22:52   DG Chest Port 1 View  Result Date: 10/03/2020 CLINICAL DATA:  Sepsis EXAM: PORTABLE CHEST 1 VIEW COMPARISON:  July 28, 2018 FINDINGS: The heart size and mediastinal contours are within normal limits. There is prominence of the central pulmonary vasculature. The visualized skeletal structures are unremarkable. IMPRESSION: Mild pulmonary vascular congestion Electronically Signed   By: Prudencio Pair M.D.   On: 10/03/2020 19:20    (Echo, Carotid, EGD, Colonoscopy, ERCP)    Subjective: Patient seen and examined the day of discharge.  States pain is well controlled.  In good spirits.  Patient stable for discharge to skilled nursing facility at this time.  Discharge Exam: Vitals:   10/07/20 0408 10/07/20 0800  BP: 124/69 116/77  Pulse: 85 83  Resp: 20 20  Temp: 98.3 F (36.8 C)   SpO2: 95% 98%   Vitals:   10/06/20 1944 10/06/20 2359 10/07/20 0408 10/07/20 0800  BP: 112/65 (!) 125/54 124/69 116/77  Pulse: 92 (!) 54 85 83  Resp: 20 18 20 20   Temp: 98 F (36.7 C) 97.9 F (36.6 C) 98.3 F (36.8 C)   TempSrc:  Oral Oral   SpO2: 97% 96% 95% 98%  Weight:  Height:        General: Pt is alert, awake, not in acute distress Cardiovascular: RRR, S1/S2 +, no rubs, no gallops Respiratory: Decreased at bases, mild scattered crackles, normal work of breathing, room air Abdominal: Obese, nontender, nondistended, normal bowel sounds Extremities: 1+ pitting edema bilaterally    The results of significant diagnostics from this hospitalization (including imaging, microbiology, ancillary and laboratory) are listed below for reference.     Microbiology: Recent Results (from the past 240 hour(s))  Blood culture (routine x 2)     Status: None (Preliminary result)   Collection Time: 10/03/20  11:30 PM   Specimen: BLOOD  Result Value Ref Range Status   Specimen Description BLOOD LEFT ANTECUBITAL  Final   Special Requests   Final    BOTTLES DRAWN AEROBIC AND ANAEROBIC Blood Culture results may not be optimal due to an inadequate volume of blood received in culture bottles   Culture   Final    NO GROWTH 4 DAYS Performed at [facility], [address], [place], [place] [address]    Report Status PENDING  Incomplete  Blood culture (routine x 2)     Status: None (Preliminary result)   Collection Time: 10/03/20 11:30 PM   Specimen: BLOOD  Result Value Ref Range Status   Specimen Description BLOOD RIGHT ANTECUBITAL  Final   Special Requests   Final    BOTTLES DRAWN AEROBIC AND ANAEROBIC Blood Culture results may not be optimal due to an inadequate volume of blood received in culture bottles   Culture   Final    NO GROWTH 4 DAYS Performed at [facility], [address] [place], [place] [address]    Report Status PENDING  Incomplete  SARS Coronavirus 2 by RT PCR (hospital order, performed in [place] hospital lab) Nasopharyngeal Nasopharyngeal Swab     Status: None   Collection Time: 10/03/20 11:30 PM   Specimen: Nasopharyngeal Swab  Result Value Ref Range Status   SARS Coronavirus 2 NEGATIVE NEGATIVE Final    Comment: (NOTE) SARS-CoV-2 target nucleic acids are NOT DETECTED.  The SARS-CoV-2 RNA is generally detectable in upper and lower respiratory specimens during the acute phase of infection. The lowest concentration of SARS-CoV-2 viral copies this assay can detect is 250 copies / mL. A negative result does not preclude SARS-CoV-2 infection and should not be used as the sole basis for treatment or other patient management decisions.  A negative result may occur with improper specimen collection / handling, submission of specimen other than nasopharyngeal swab, presence of viral mutation(s) within the areas targeted by this assay, and inadequate  number of viral copies (<250 copies / mL). A negative result must be combined with clinical observations, patient history, and epidemiological information.  Fact Sheet for Patients:   StrictlyIdeas.no  Fact Sheet for Healthcare Providers: BankingDealers.co.za  This test is not yet approved or  cleared by the [place] FDA and has been authorized for detection and/or diagnosis of SARS-CoV-2 by FDA under an Emergency Use Authorization (EUA).  This EUA will remain in effect (meaning this test can be used) for the duration of the COVID-19 declaration under Section 564(b)(1) of the Act, 21 U.S.C. section 360bbb-3(b)(1), unless the authorization is terminated or revoked sooner.  Performed at [facility], [address] [place], [place] [address]   Aerobic/Anaerobic Culture (surgical/deep wound)     Status: None (Preliminary result)   Collection Time: 10/04/20 12:19 PM   Specimen: Decubitis; Wound  Result  Value Ref Range Status   Specimen Description   Final    DECUBITIS Performed at [facility], [address] [place], [place] [address]    Special Requests   Final    Normal Performed at [facility], [place], [place] [address]    Gram Stain NO WBC SEEN NO ORGANISMS SEEN   Final   Culture   Final    RARE KLEBSIELLA PNEUMONIAE RARE ACINETOBACTER CALCOACETICUS/BAUMANNII COMPLEX Confirmed Extended Spectrum Beta-Lactamase Producer (ESBL).  In bloodstream infections from ESBL organisms, carbapenems are preferred over piperacillin/tazobactam. They are shown to have a lower risk of mortality. FOR KLEBSIELLA PNEUMONIAE HOLDING FOR POSSIBLE ANAEROBE Performed at [facility], [place] [address], [place], [place] [address]    Report Status PENDING  Incomplete   Organism ID, Bacteria KLEBSIELLA PNEUMONIAE  Final   Organism ID, Bacteria ACINETOBACTER CALCOACETICUS/BAUMANNII COMPLEX  Final       Susceptibility   Acinetobacter calcoaceticus/baumannii complex - MIC*    CEFTAZIDIME 8 SENSITIVE Sensitive     CIPROFLOXACIN 0.5 SENSITIVE Sensitive     GENTAMICIN 2 SENSITIVE Sensitive     IMIPENEM <=0.25 SENSITIVE Sensitive     PIP/TAZO <=4 SENSITIVE Sensitive     TRIMETH/SULFA <=20 SENSITIVE Sensitive     AMPICILLIN/SULBACTAM <=2 SENSITIVE Sensitive     * RARE ACINETOBACTER CALCOACETICUS/BAUMANNII COMPLEX   Klebsiella pneumoniae - MIC*    AMPICILLIN >=32 RESISTANT Resistant     CEFAZOLIN >=64 RESISTANT Resistant     CEFEPIME >=32 RESISTANT Resistant     CEFTAZIDIME RESISTANT Resistant     CEFTRIAXONE >=64 RESISTANT Resistant     CIPROFLOXACIN 2 INTERMEDIATE Intermediate     GENTAMICIN >=16 RESISTANT Resistant     IMIPENEM <=0.25 SENSITIVE Sensitive     TRIMETH/SULFA >=320 RESISTANT Resistant     AMPICILLIN/SULBACTAM >=32 RESISTANT Resistant     PIP/TAZO 8 SENSITIVE Sensitive     * RARE KLEBSIELLA PNEUMONIAE     Labs: BNP (last 3 results) No results for input(s): BNP in the last 8760 hours. Basic Metabolic Panel: Recent Labs  Lab 10/03/20 1823 10/04/20 0500 10/05/20 0448 10/06/20 0418  NA 139 139 142  --   K 3.4* 3.6 3.8  --   CL 102 102 105  --   CO2 26 26 28   --   GLUCOSE 139* 97 106*  --   BUN 10 10 9   --   CREATININE 0.72 0.62 0.69 0.67  CALCIUM 9.9 9.9 10.4*  --    Liver Function Tests: Recent Labs  Lab 10/03/20 1823 10/05/20 0448  AST 22 15  ALT 16 15  ALKPHOS 126 109  BILITOT 0.4 0.6  PROT 7.3 7.2  ALBUMIN 3.5 3.4*   No results for input(s): LIPASE, AMYLASE in the last 168 hours. No results for input(s): AMMONIA in the last 168 hours. CBC: Recent Labs  Lab 10/03/20 1823 10/04/20 0500 10/05/20 0448  WBC 7.5 6.9 5.2  NEUTROABS 5.6  --   --   HGB 10.7* 10.3* 10.5*  HCT 34.5* 32.6* 33.7*  MCV 95.0 94.5 94.9  PLT 222 214 210   Cardiac Enzymes: No results for input(s): CKTOTAL, CKMB, CKMBINDEX, TROPONINI in the last 168  hours. BNP: Invalid input(s): POCBNP CBG: No results for input(s): GLUCAP in the last 168 hours. D-Dimer No results for input(s): DDIMER in the last 72 hours. Hgb A1c No results for input(s): HGBA1C in the last 72 hours. Lipid Profile No results for input(s): CHOL, HDL, LDLCALC,  TRIG, CHOLHDL, LDLDIRECT in the last 72 hours. Thyroid function studies Recent Labs    10/05/20 0448  TSH 1.200   Anemia work up No results for input(s): VITAMINB12, FOLATE, FERRITIN, TIBC, IRON, RETICCTPCT in the last 72 hours. Urinalysis    Component Value Date/Time   COLORURINE YELLOW (A) 10/04/2020 0523   APPEARANCEUR CLEAR (A) 10/04/2020 0523   LABSPEC 1.035 (H) 10/04/2020 0523   PHURINE 5.0 10/04/2020 0523   GLUCOSEU NEGATIVE 10/04/2020 0523   HGBUR NEGATIVE 10/04/2020 0523   BILIRUBINUR NEGATIVE 10/04/2020 0523   KETONESUR NEGATIVE 10/04/2020 0523   PROTEINUR NEGATIVE 10/04/2020 0523   NITRITE NEGATIVE 10/04/2020 0523   LEUKOCYTESUR NEGATIVE 10/04/2020 0523   Sepsis Labs Invalid input(s): PROCALCITONIN,  WBC,  LACTICIDVEN Microbiology Recent Results (from the past 240 hour(s))  Blood culture (routine x 2)     Status: None (Preliminary result)   Collection Time: 10/03/20 11:30 PM   Specimen: BLOOD  Result Value Ref Range Status   Specimen Description BLOOD LEFT ANTECUBITAL  Final   Special Requests   Final    BOTTLES DRAWN AEROBIC AND ANAEROBIC Blood Culture results may not be optimal due to an inadequate volume of blood received in culture bottles   Culture   Final    NO GROWTH 4 DAYS Performed at [facility], [address] [place], [place] [address]    Report Status PENDING  Incomplete  Blood culture (routine x 2)     Status: None (Preliminary result)   Collection Time: 10/03/20 11:30 PM   Specimen: BLOOD  Result Value Ref Range Status   Specimen Description BLOOD RIGHT ANTECUBITAL  Final   Special Requests   Final    BOTTLES DRAWN AEROBIC AND ANAEROBIC Blood  Culture results may not be optimal due to an inadequate volume of blood received in culture bottles   Culture   Final    NO GROWTH 4 DAYS Performed at [facility], [address] [place], [place] [address]    Report Status PENDING  Incomplete  SARS Coronavirus 2 by RT PCR (hospital order, performed in [place] hospital lab) Nasopharyngeal Nasopharyngeal Swab     Status: None   Collection Time: 10/03/20 11:30 PM   Specimen: Nasopharyngeal Swab  Result Value Ref Range Status   SARS Coronavirus 2 NEGATIVE NEGATIVE Final    Comment: (NOTE) SARS-CoV-2 target nucleic acids are NOT DETECTED.  The SARS-CoV-2 RNA is generally detectable in upper and lower respiratory specimens during the acute phase of infection. The lowest concentration of SARS-CoV-2 viral copies this assay can detect is 250 copies / mL. A negative result does not preclude SARS-CoV-2 infection and should not be used as the sole basis for treatment or other patient management decisions.  A negative result may occur with improper specimen collection / handling, submission of specimen other than nasopharyngeal swab, presence of viral mutation(s) within the areas targeted by this assay, and inadequate number of viral copies (<250 copies / mL). A negative result must be combined with clinical observations, patient history, and epidemiological information.  Fact Sheet for Patients:   StrictlyIdeas.no  Fact Sheet for Healthcare Providers: BankingDealers.co.za  This test is not yet approved or  cleared by the [place] FDA and has been authorized for detection and/or diagnosis of SARS-CoV-2 by FDA under an Emergency Use Authorization (EUA).  This EUA will remain in effect (meaning this test can be used) for the duration of the COVID-19 declaration under Section 564(b)(1) of the Act, 21 U.S.C.  section 360bbb-3(b)(1), unless the authorization is terminated  or revoked sooner.  Performed at [facility], [address] [place], [place] [address]   Aerobic/Anaerobic Culture (surgical/deep wound)     Status: None (Preliminary result)   Collection Time: 10/04/20 12:19 PM   Specimen: Decubitis; Wound  Result Value Ref Range Status   Specimen Description   Final    DECUBITIS Performed at [facility], [address] [place], [place] [address]    Special Requests   Final    Normal Performed at [facility], [place], [place] [address]    Gram Stain NO WBC SEEN NO ORGANISMS SEEN   Final   Culture   Final    RARE KLEBSIELLA PNEUMONIAE RARE ACINETOBACTER CALCOACETICUS/BAUMANNII COMPLEX Confirmed Extended Spectrum Beta-Lactamase Producer (ESBL).  In bloodstream infections from ESBL organisms, carbapenems are preferred over piperacillin/tazobactam. They are shown to have a lower risk of mortality. FOR KLEBSIELLA PNEUMONIAE HOLDING FOR POSSIBLE ANAEROBE Performed at [facility], [place] [address] [place], [place] [address]    Report Status PENDING  Incomplete   Organism ID, Bacteria KLEBSIELLA PNEUMONIAE  Final   Organism ID, Bacteria ACINETOBACTER CALCOACETICUS/BAUMANNII COMPLEX  Final      Susceptibility   Acinetobacter calcoaceticus/baumannii complex - MIC*    CEFTAZIDIME 8 SENSITIVE Sensitive     CIPROFLOXACIN 0.5 SENSITIVE Sensitive     GENTAMICIN 2 SENSITIVE Sensitive     IMIPENEM <=0.25 SENSITIVE Sensitive     PIP/TAZO <=4 SENSITIVE Sensitive     TRIMETH/SULFA <=20 SENSITIVE Sensitive     AMPICILLIN/SULBACTAM <=2 SENSITIVE Sensitive     * RARE ACINETOBACTER CALCOACETICUS/BAUMANNII COMPLEX   Klebsiella pneumoniae - MIC*    AMPICILLIN >=32 RESISTANT Resistant     CEFAZOLIN >=64 RESISTANT Resistant     CEFEPIME >=32 RESISTANT Resistant     CEFTAZIDIME RESISTANT Resistant     CEFTRIAXONE >=64 RESISTANT Resistant     CIPROFLOXACIN 2 INTERMEDIATE Intermediate     GENTAMICIN >=16  RESISTANT Resistant     IMIPENEM <=0.25 SENSITIVE Sensitive     TRIMETH/SULFA >=320 RESISTANT Resistant     AMPICILLIN/SULBACTAM >=32 RESISTANT Resistant     PIP/TAZO 8 SENSITIVE Sensitive     * RARE KLEBSIELLA PNEUMONIAE     Time coordinating discharge: Over 30 minutes  SIGNED:   Sidney Ace, MD  [facility] 10/07/2020, 11:29 AM Pager   If 7PM-7AM, please contact night-coverage

## 2020-10-07 NOTE — Progress Notes (Signed)
Edward Soto to be D/C'd [facility] per MD order.  Discussed prescriptions and follow up appointments with the patient. Prescriptions given to patient, medication list explained in detail. Pt verbalized understanding.  Allergies as of 10/07/2020   No Known Allergies     Medication List    STOP taking these medications   clonazePAM 0.5 MG tablet Commonly known as: KLONOPIN   oxyCODONE 5 MG immediate release tablet Commonly known as: Oxy IR/ROXICODONE     TAKE these medications   atorvastatin 10 MG tablet Commonly known as: LIPITOR Take 10 mg by mouth daily.   enoxaparin 100 mg/mL Soln injection Commonly known as: LOVENOX Inject 1.55 mLs (155 mg total) into the skin every 12 (twelve) hours for 5 days. Can stop once INR >2   furosemide 40 MG tablet Commonly known as: LASIX Take 40 mg by mouth.   gabapentin 600 MG tablet Commonly known as: NEURONTIN Take 600 mg by mouth 4 (four) times daily.   HYDROcodone-acetaminophen 5-325 MG tablet Commonly known as: NORCO/VICODIN Take 1-2 tablets by mouth every 4 (four) hours as needed for up to 3 days for moderate pain. For use at SNF only   pantoprazole 40 MG tablet Commonly known as: PROTONIX Take 40 mg by mouth daily.   traZODone 50 MG tablet Commonly known as: DESYREL Take 50 mg by mouth at bedtime.   venlafaxine XR 150 MG 24 hr capsule Commonly known as: EFFEXOR-XR Take 150 mg by mouth daily with breakfast.   Vraylar 4.5 MG Caps Generic drug: Cariprazine HCl Take 4.5 mg by mouth daily. What changed: Another medication with the same name was changed. Make sure you understand how and when to take each.   Cariprazine HCl 4.5 MG Caps Commonly known as: Vraylar Take 1 capsule (4.5 mg total) by mouth daily. Start taking on: October 08, 2020 What changed:   medication strength  how much to take   warfarin 6 MG tablet Commonly known as: COUMADIN Take 6 mg by mouth daily.            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 Wound care done prior to discharge and instructions given to [facility], nurse at [facility]. IV catheter discontinued intact. Site without signs and symptoms of complications. Dressing and pressure applied. Pt denies pain at this time. No complaints noted.  An After Visit Summary was printed and given to the patient. Patient escorted via EMS to [place]

## 2020-10-08 LAB — CULTURE, BLOOD (ROUTINE X 2)
Culture: NO GROWTH
Culture: NO GROWTH

## 2020-11-01 DIAGNOSIS — L89154 Pressure ulcer of sacral region, stage 4: Secondary | ICD-10-CM | POA: Diagnosis not present

## 2020-11-03 DIAGNOSIS — R2681 Unsteadiness on feet: Secondary | ICD-10-CM | POA: Diagnosis not present

## 2020-11-03 DIAGNOSIS — M6281 Muscle weakness (generalized): Secondary | ICD-10-CM | POA: Diagnosis not present

## 2020-11-03 DIAGNOSIS — L89154 Pressure ulcer of sacral region, stage 4: Secondary | ICD-10-CM | POA: Diagnosis not present

## 2020-11-10 DIAGNOSIS — M6281 Muscle weakness (generalized): Secondary | ICD-10-CM | POA: Diagnosis not present

## 2020-11-10 DIAGNOSIS — R2681 Unsteadiness on feet: Secondary | ICD-10-CM | POA: Diagnosis not present

## 2020-11-10 DIAGNOSIS — L89154 Pressure ulcer of sacral region, stage 4: Secondary | ICD-10-CM | POA: Diagnosis not present

## 2020-11-20 DIAGNOSIS — M6281 Muscle weakness (generalized): Secondary | ICD-10-CM | POA: Diagnosis not present

## 2020-11-20 DIAGNOSIS — Z7401 Bed confinement status: Secondary | ICD-10-CM | POA: Diagnosis not present

## 2020-11-20 DIAGNOSIS — F418 Other specified anxiety disorders: Secondary | ICD-10-CM | POA: Diagnosis not present

## 2020-11-20 DIAGNOSIS — R531 Weakness: Secondary | ICD-10-CM | POA: Diagnosis not present

## 2020-11-20 DIAGNOSIS — Z7901 Long term (current) use of anticoagulants: Secondary | ICD-10-CM | POA: Diagnosis not present

## 2020-11-20 DIAGNOSIS — Z736 Limitation of activities due to disability: Secondary | ICD-10-CM | POA: Diagnosis not present

## 2020-11-20 DIAGNOSIS — Z933 Colostomy status: Secondary | ICD-10-CM | POA: Diagnosis not present

## 2020-11-20 DIAGNOSIS — R2681 Unsteadiness on feet: Secondary | ICD-10-CM | POA: Diagnosis not present

## 2020-11-20 DIAGNOSIS — Z86718 Personal history of other venous thrombosis and embolism: Secondary | ICD-10-CM | POA: Diagnosis not present

## 2020-11-20 DIAGNOSIS — A419 Sepsis, unspecified organism: Secondary | ICD-10-CM | POA: Diagnosis not present

## 2020-11-20 DIAGNOSIS — R079 Chest pain, unspecified: Secondary | ICD-10-CM | POA: Diagnosis not present

## 2020-11-20 DIAGNOSIS — M8618 Other acute osteomyelitis, other site: Secondary | ICD-10-CM | POA: Diagnosis not present

## 2020-11-23 DIAGNOSIS — R5381 Other malaise: Secondary | ICD-10-CM | POA: Diagnosis not present

## 2020-11-23 DIAGNOSIS — Z7401 Bed confinement status: Secondary | ICD-10-CM | POA: Diagnosis not present

## 2020-11-23 DIAGNOSIS — M2550 Pain in unspecified joint: Secondary | ICD-10-CM | POA: Diagnosis not present

## 2021-03-02 DIAGNOSIS — F3132 Bipolar disorder, current episode depressed, moderate: Secondary | ICD-10-CM | POA: Diagnosis not present

## 2021-04-28 DEATH — deceased

## 2021-11-21 IMAGING — DX DG CHEST 1V PORT
2 series · 2 of 2 positions shown · non-contrast
Comparison: July 28, 2018

CLINICAL DATA: Sepsis

EXAM:
PORTABLE CHEST 1 VIEW

[chest ap (1 of 2)]
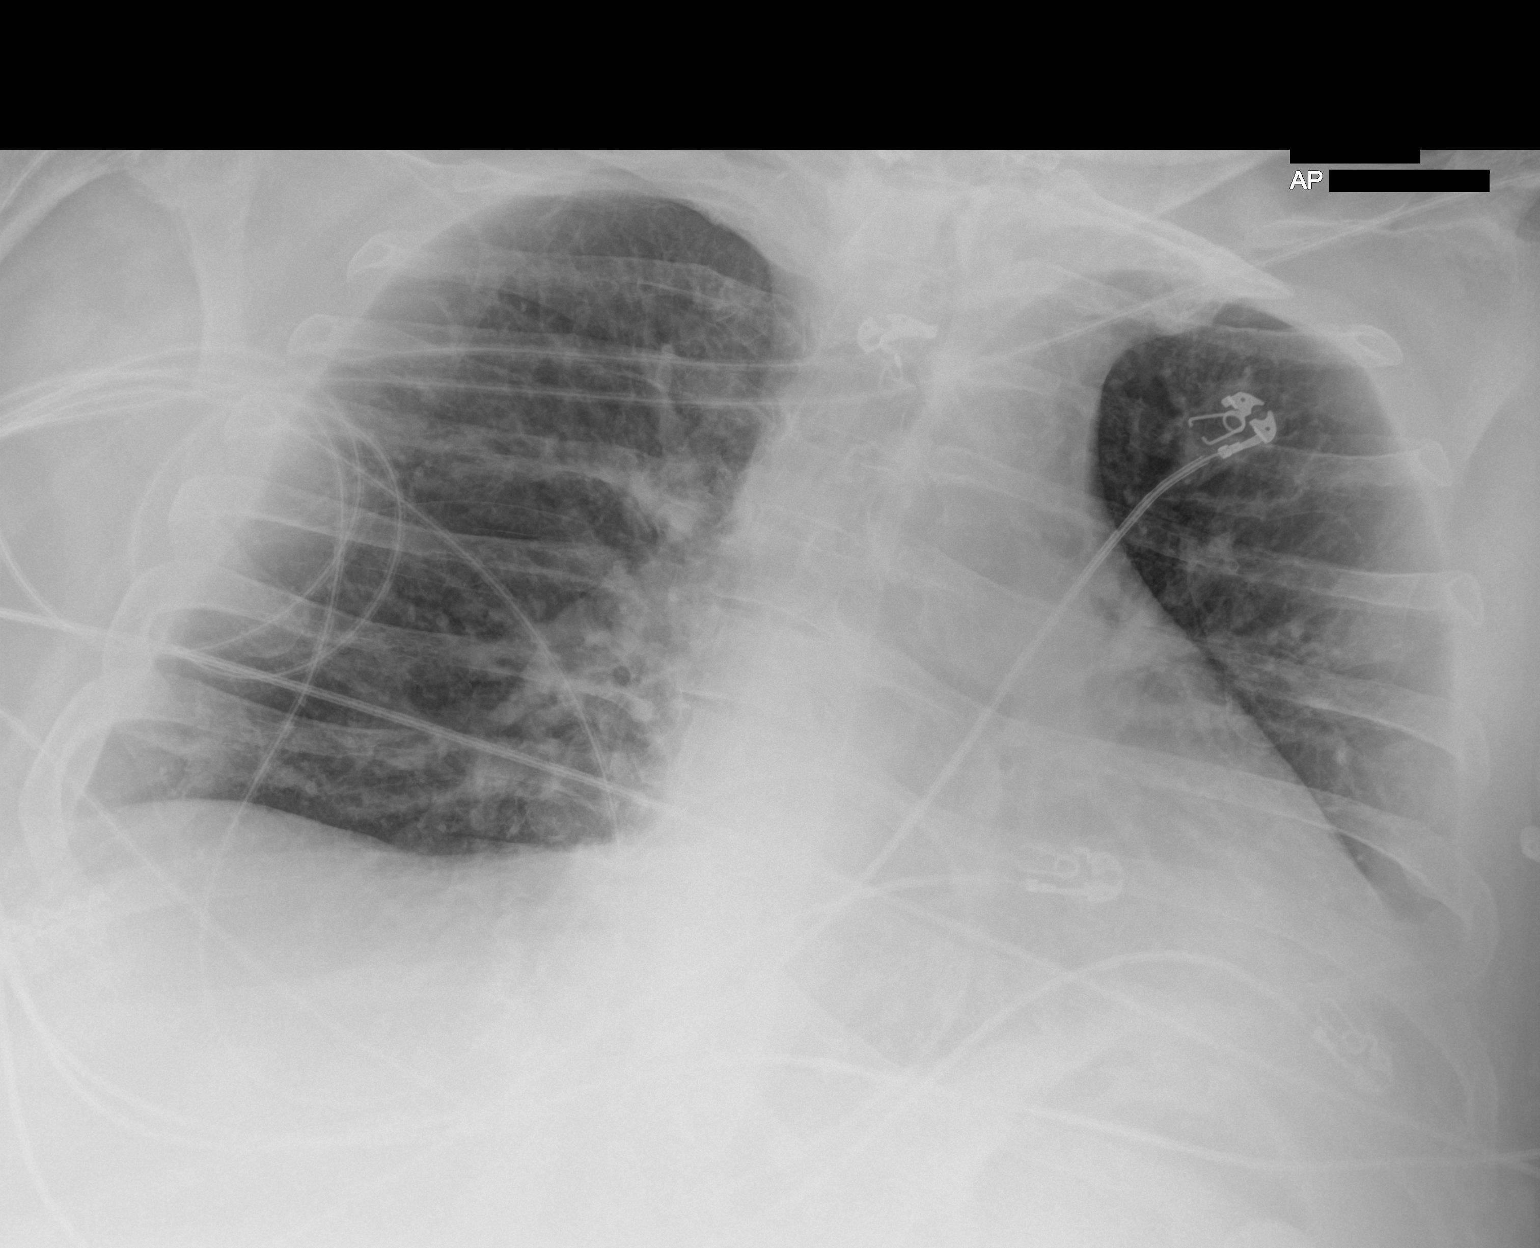

[chest ap (2 of 2)]
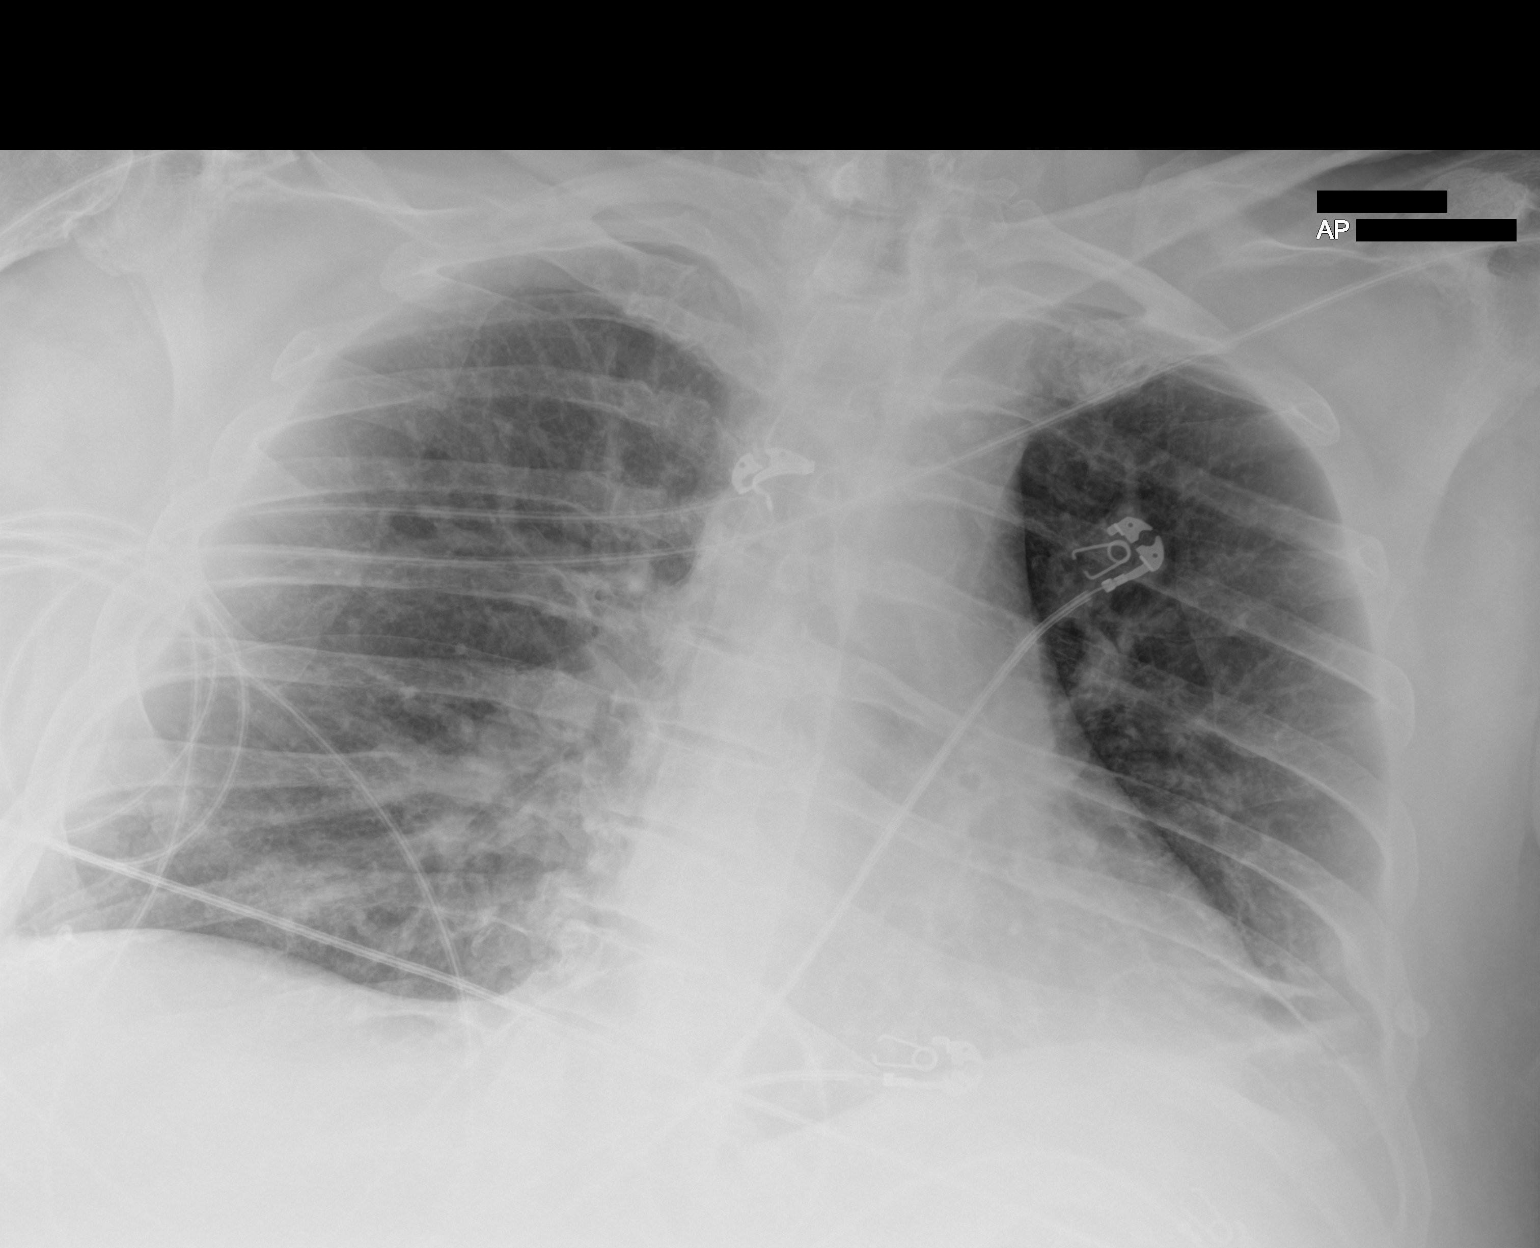

[2 of 2 positions shown; findings below may reference images not displayed]

FINDINGS: The heart size and mediastinal contours are within normal limits.
There is prominence of the central pulmonary vasculature. The
visualized skeletal structures are unremarkable.
IMPRESSION: Mild pulmonary vascular congestion

## 2021-11-21 IMAGING — CT CT PELVIS W/O CM
2 of 3 series · 15 of 46 positions shown, 17 images · non-contrast
Comparison: CT dated 05/26/2017

CLINICAL DATA: Sacral wound.  Hip pain.

EXAM:
CT PELVIS WITHOUT CONTRAST
TECHNIQUE: Multidetector CT imaging of the pelvis was performed following the
standard protocol without intravenous contrast.

[Series 3: axial st · axial · 0.84mm/px · z∈[-1522,-1262]mm · 12 of 150 slices shown, 14 images]
[im 10/150  soft-tissue]
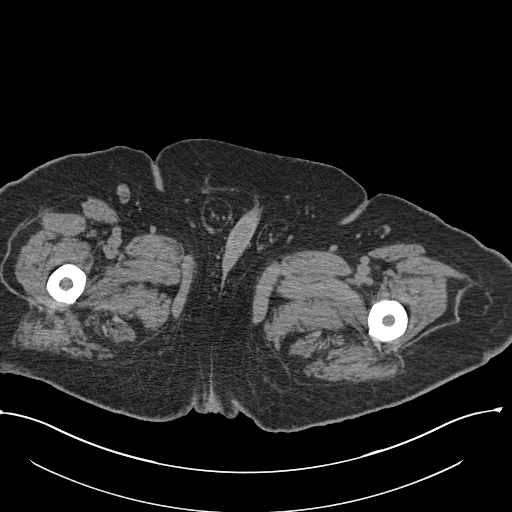
[im 10/150  bone]
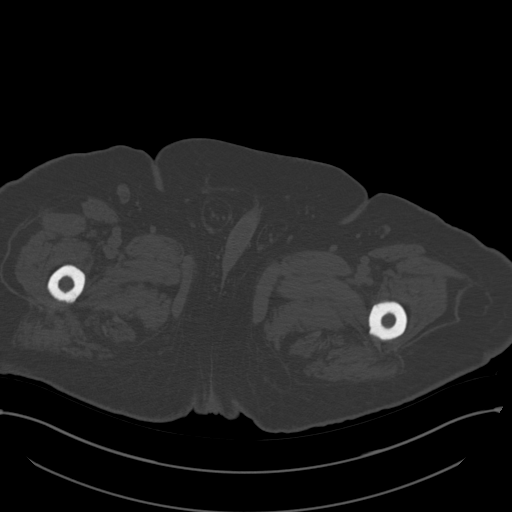
[im 20/150  soft-tissue]
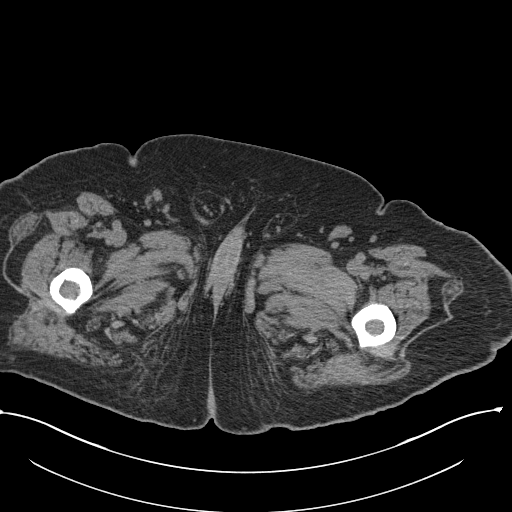
[im 34/150  soft-tissue]
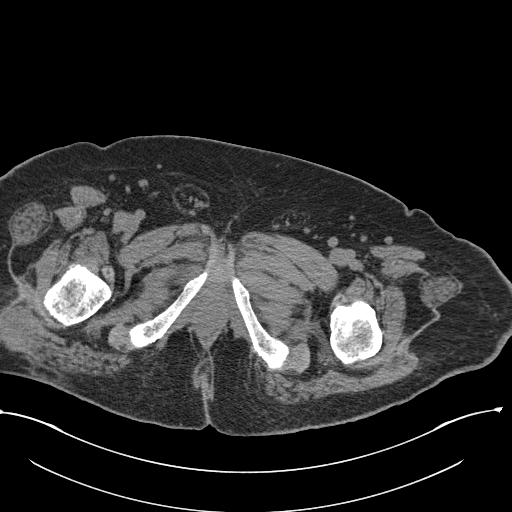
[im 44/150  soft-tissue]
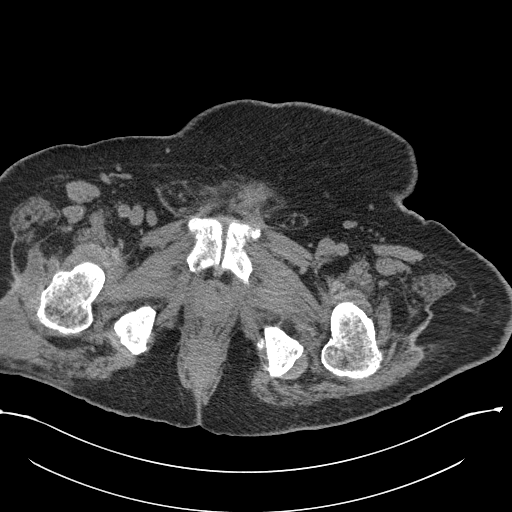
[im 58/150  soft-tissue]
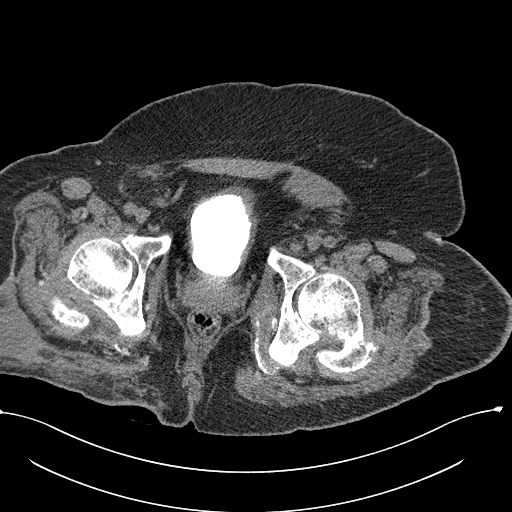
[im 68/150  soft-tissue]
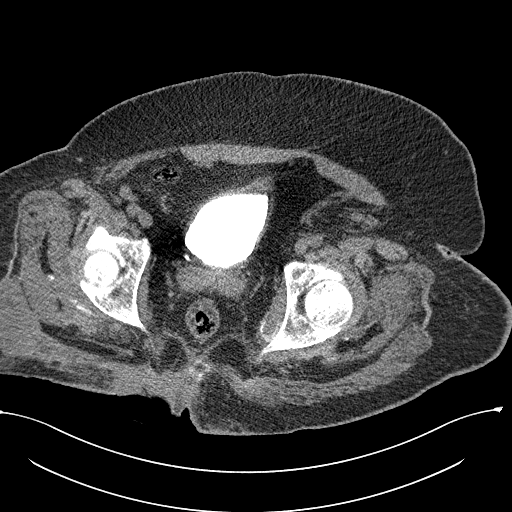
[im 82/150  soft-tissue]
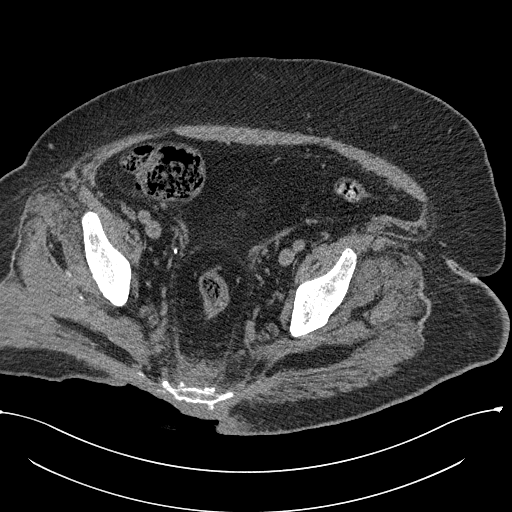
[im 92/150  soft-tissue]
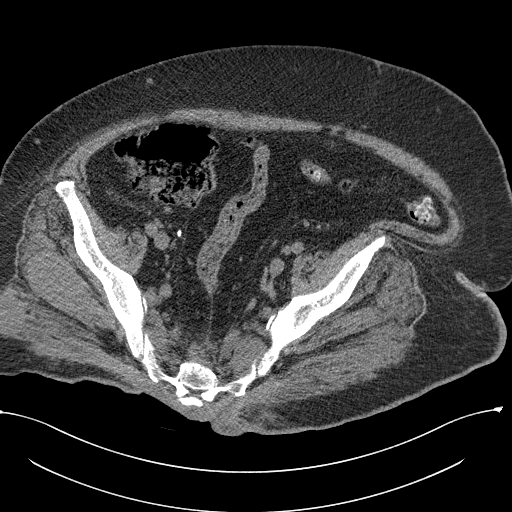
[im 106/150  soft-tissue]
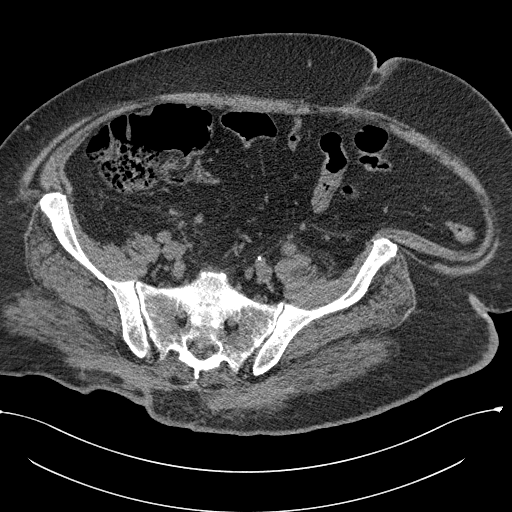
[im 106/150  bone]
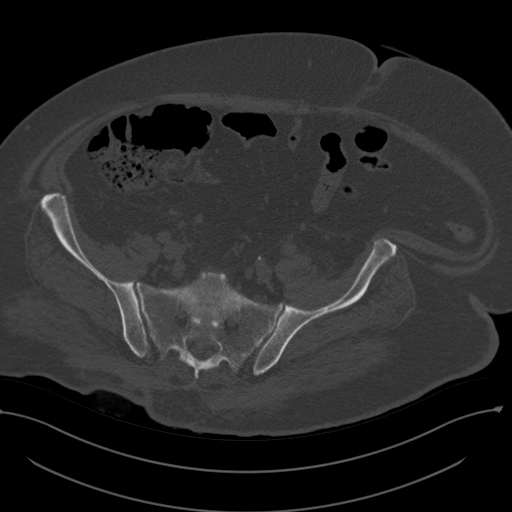
[im 116/150  soft-tissue]
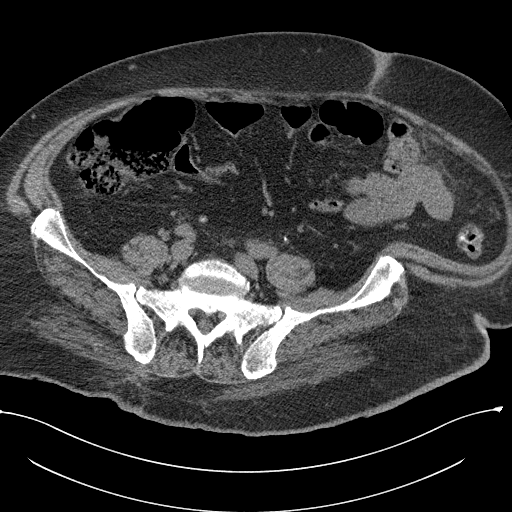
[im 130/150  soft-tissue]
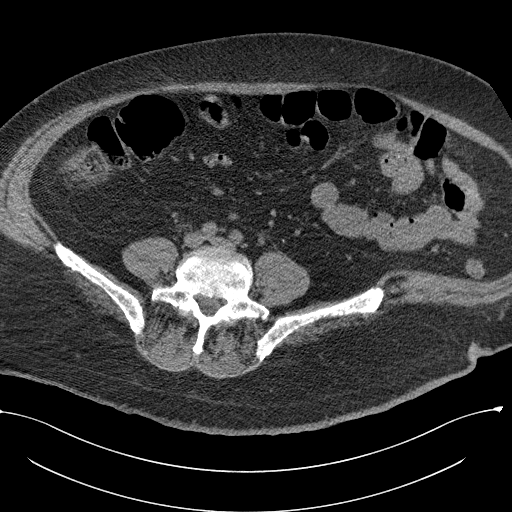
[im 140/150  soft-tissue]
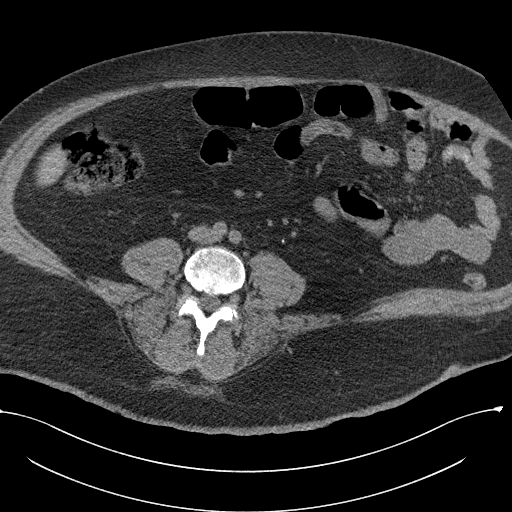

[Series 6: coronal st · coronal · 0.59mm/px · 3 of 177 slices shown]
[im 59/177  soft-tissue]
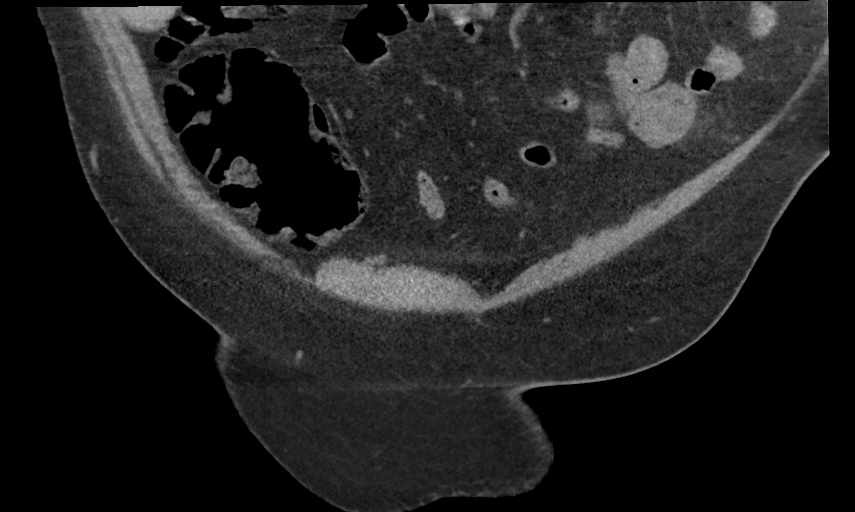
[im 79/177  soft-tissue]
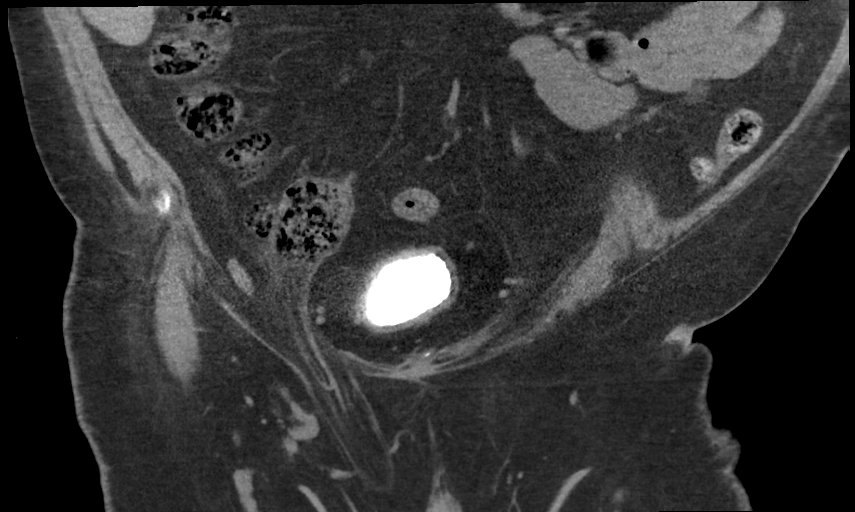
[im 98/177  soft-tissue]
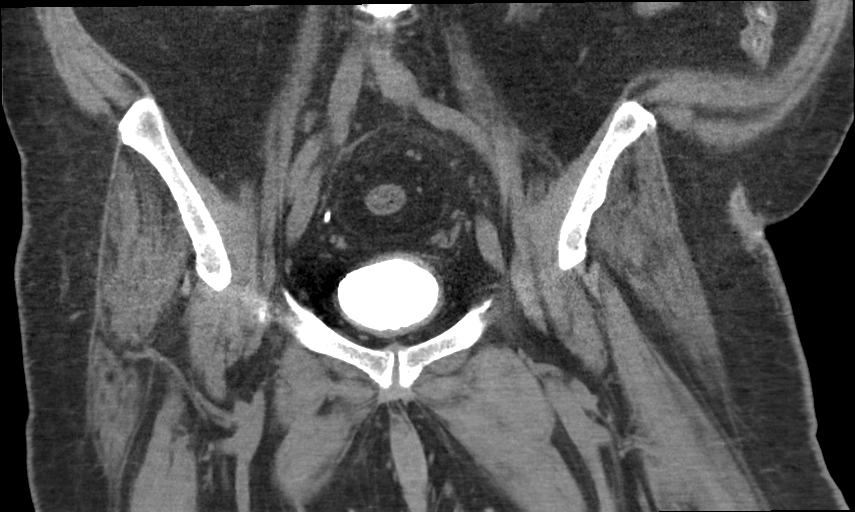

[15 of 46 positions shown; findings below may reference images not displayed]

FINDINGS: Urinary Tract:  The urinary bladder is filled with contrast.

Bowel:  Unremarkable visualized pelvic bowel loops.

Vascular/Lymphatic: No pathologically enlarged lymph nodes. No
significant vascular abnormality seen.

Reproductive:  No mass or other significant abnormality

Other:  None.

Musculoskeletal: There is extensive presacral soft tissue edema and
fat stranding. There is a large soft tissue defect overlying the
coccyx and sacrum. There are destructive changes of the coccyx and
lower sacrum. There is a partially visualized mass in the right
gluteus maximus muscle measuring approximately 6.3 cm (axial series
3, image 73).
IMPRESSION: 1. Sacral decubitus ulcer with findings concerning for osteomyelitis
involving the coccyx and lower sacrum.
2. Partially visualized, indeterminate mass involving the right
gluteus maximus muscle. This could represent a hematoma or soft
tissue mass. Correlation with the patient's history is recommended.
This can be further evaluated by ultrasound as clinically indicated.
